# Patient Record
Sex: Female | Born: 1935
Health system: Southern US, Community
[De-identification: ages and names within clinical notes are randomized; demographics above are authoritative.]

## PROBLEM LIST (undated history)

## (undated) DIAGNOSIS — R600 Localized edema: Secondary | ICD-10-CM

## (undated) DIAGNOSIS — K219 Gastro-esophageal reflux disease without esophagitis: Secondary | ICD-10-CM

## (undated) DIAGNOSIS — M720 Palmar fascial fibromatosis [Dupuytren]: Secondary | ICD-10-CM

## (undated) DIAGNOSIS — M1990 Unspecified osteoarthritis, unspecified site: Secondary | ICD-10-CM

## (undated) DIAGNOSIS — D649 Anemia, unspecified: Secondary | ICD-10-CM

## (undated) DIAGNOSIS — I4891 Unspecified atrial fibrillation: Secondary | ICD-10-CM

## (undated) DIAGNOSIS — E039 Hypothyroidism, unspecified: Secondary | ICD-10-CM

## (undated) DIAGNOSIS — R0602 Shortness of breath: Secondary | ICD-10-CM

## (undated) DIAGNOSIS — C801 Malignant (primary) neoplasm, unspecified: Secondary | ICD-10-CM

## (undated) DIAGNOSIS — R7309 Other abnormal glucose: Secondary | ICD-10-CM

## (undated) DIAGNOSIS — I10 Essential (primary) hypertension: Secondary | ICD-10-CM

## (undated) DIAGNOSIS — F411 Generalized anxiety disorder: Secondary | ICD-10-CM

## (undated) DIAGNOSIS — E079 Disorder of thyroid, unspecified: Secondary | ICD-10-CM

## (undated) DIAGNOSIS — I441 Atrioventricular block, second degree: Secondary | ICD-10-CM

## (undated) HISTORY — DX: Disorder of thyroid, unspecified: E07.9

## (undated) HISTORY — DX: Palmar fascial fibromatosis (dupuytren): M72.0

## (undated) HISTORY — DX: Localized edema: R60.0

## (undated) HISTORY — DX: Generalized anxiety disorder: F41.1

## (undated) HISTORY — DX: Atrioventricular block, second degree: I44.1

## (undated) HISTORY — DX: Other abnormal glucose: R73.09

## (undated) HISTORY — PX: DILATION AND CURETTAGE OF UTERUS: SHX78

## (undated) HISTORY — PX: HAND SURGERY: SHX662

## (undated) HISTORY — DX: Essential (primary) hypertension: I10

## (undated) HISTORY — DX: Shortness of breath: R06.02

## (undated) HISTORY — PX: ABDOMINAL HYSTERECTOMY: SHX81

## (undated) HISTORY — PX: TONSILLECTOMY: SUR1361

## (undated) HISTORY — DX: Unspecified atrial fibrillation: I48.91

---

## 2005-05-03 ENCOUNTER — Ambulatory Visit: Payer: Self-pay | Admitting: Family Medicine

## 2005-08-25 LAB — HM SIGMOIDOSCOPY

## 2006-06-18 ENCOUNTER — Ambulatory Visit: Payer: Self-pay | Admitting: Family Medicine

## 2007-09-24 ENCOUNTER — Ambulatory Visit: Payer: Self-pay | Admitting: Family Medicine

## 2007-12-11 ENCOUNTER — Emergency Department: Payer: Self-pay | Admitting: Emergency Medicine

## 2008-12-07 ENCOUNTER — Ambulatory Visit: Payer: Self-pay | Admitting: Family Medicine

## 2010-02-23 ENCOUNTER — Ambulatory Visit: Payer: Self-pay | Admitting: Internal Medicine

## 2010-06-28 ENCOUNTER — Ambulatory Visit: Payer: Self-pay | Admitting: Oncology

## 2010-07-13 ENCOUNTER — Ambulatory Visit: Payer: Self-pay | Admitting: Oncology

## 2010-09-28 ENCOUNTER — Ambulatory Visit: Payer: Self-pay | Admitting: Oncology

## 2010-10-12 ENCOUNTER — Ambulatory Visit: Payer: Self-pay | Admitting: Oncology

## 2010-10-23 ENCOUNTER — Ambulatory Visit: Payer: Self-pay | Admitting: Internal Medicine

## 2010-12-04 ENCOUNTER — Encounter: Payer: Self-pay | Admitting: Internal Medicine

## 2010-12-12 ENCOUNTER — Encounter: Payer: Self-pay | Admitting: Internal Medicine

## 2011-02-05 ENCOUNTER — Ambulatory Visit: Payer: Self-pay | Admitting: Oncology

## 2011-02-11 ENCOUNTER — Ambulatory Visit: Payer: Self-pay | Admitting: Oncology

## 2011-02-27 ENCOUNTER — Ambulatory Visit: Payer: Self-pay | Admitting: Internal Medicine

## 2011-02-28 ENCOUNTER — Ambulatory Visit: Payer: Self-pay | Admitting: Internal Medicine

## 2011-04-30 ENCOUNTER — Other Ambulatory Visit: Payer: Self-pay | Admitting: Internal Medicine

## 2011-04-30 ENCOUNTER — Telehealth: Payer: Self-pay | Admitting: Internal Medicine

## 2011-04-30 DIAGNOSIS — R42 Dizziness and giddiness: Secondary | ICD-10-CM

## 2011-04-30 MED ORDER — MECLIZINE HCL 25 MG PO TABS: 25.0000 mg | ORAL_TABLET | Freq: Three times a day (TID) | ORAL | Status: AC | PRN

## 2011-04-30 NOTE — Telephone Encounter (Signed)
Destiny Burns at [facility] pt would like a rx of meclizine 25mg   She got  This from a walk in dr peed and would like dr walker to write a rx

## 2011-04-30 NOTE — Telephone Encounter (Signed)
I called it in 

## 2011-04-30 NOTE — Telephone Encounter (Signed)
Patient is asking for a rx for meclizine for vertigo. Uses [facility].

## 2011-05-31 ENCOUNTER — Other Ambulatory Visit: Payer: Self-pay | Admitting: *Deleted

## 2011-05-31 ENCOUNTER — Other Ambulatory Visit: Payer: Self-pay | Admitting: Internal Medicine

## 2011-05-31 MED ORDER — HYDROCHLOROTHIAZIDE 25 MG PO TABS: 25.0000 mg | ORAL_TABLET | Freq: Every day | ORAL | Status: DC

## 2011-06-01 ENCOUNTER — Encounter: Payer: Self-pay | Admitting: Internal Medicine

## 2011-06-01 ENCOUNTER — Ambulatory Visit (INDEPENDENT_AMBULATORY_CARE_PROVIDER_SITE_OTHER): Payer: Medicare Other | Admitting: Internal Medicine

## 2011-06-01 VITALS — BP 148/72 | HR 69 | Temp 97.9°F | Resp 14 | Ht 64.5 in | Wt 162.2 lb

## 2011-06-01 DIAGNOSIS — M25519 Pain in unspecified shoulder: Secondary | ICD-10-CM

## 2011-06-01 DIAGNOSIS — M25511 Pain in right shoulder: Secondary | ICD-10-CM

## 2011-06-01 DIAGNOSIS — I10 Essential (primary) hypertension: Secondary | ICD-10-CM

## 2011-06-01 DIAGNOSIS — E039 Hypothyroidism, unspecified: Secondary | ICD-10-CM

## 2011-06-01 DIAGNOSIS — E785 Hyperlipidemia, unspecified: Secondary | ICD-10-CM | POA: Insufficient documentation

## 2011-06-01 MED ORDER — HYDROCHLOROTHIAZIDE 25 MG PO TABS: 25.0000 mg | ORAL_TABLET | Freq: Every day | ORAL | Status: DC

## 2011-06-01 MED ORDER — MELOXICAM 15 MG PO TABS: 15.0000 mg | ORAL_TABLET | Freq: Every day | ORAL | Status: DC

## 2011-06-01 MED ORDER — LEVOTHYROXINE SODIUM 25 MCG PO TABS: 25.0000 ug | ORAL_TABLET | Freq: Every day | ORAL | Status: DC

## 2011-06-01 NOTE — Progress Notes (Signed)
Subjective:    Patient ID: Destiny Burns, female    DOB: 10/17/1926, 77 y.o.   MRN: [id number]  HPI Destiny Burns is an 77 year old female with a history of hypertension, hypothyroidism, and a recent history of right shoulder pain. Her right shoulder pain first began in April after a fall. She has completed physical therapy with gradual improvement in her right shoulder pain. She has refused imaging with MRI to evaluate for rotator cuff tear because she is not interested in surgical intervention. She feels that her strength continues to improve, however she still has difficulty completing some tasks such as gardening. Her work is mostly limited by pain no weakness.  She reports full compliance with her medications. She denies any other complaints today.  Outpatient Encounter Prescriptions as of 06/01/2011  Medication Sig Dispense Refill  . aspirin 81 MG tablet Take 81 mg by mouth daily.        Marland Kitchen CALCIUM PO Take 1 tablet by mouth 3 (three) times daily.        . cholecalciferol (VITAMIN D) 1000 UNITS tablet Take 1,000 Units by mouth daily.        . hydrochlorothiazide (HYDRODIURIL) 25 MG tablet Take 1 tablet (25 mg total) by mouth daily.  90 tablet  4  . levothyroxine (SYNTHROID, LEVOTHROID) 25 MCG tablet Take 1 tablet (25 mcg total) by mouth daily.  90 tablet  4  . loratadine (CLARITIN) 10 MG tablet Take 10 mg by mouth daily.        . meclizine (ANTIVERT) 25 MG tablet Take 1 tablet (25 mg total) by mouth 3 (three) times daily as needed for dizziness or nausea.  30 tablet  1    Review of Systems  Constitutional: Negative for fever, chills, appetite change, fatigue and unexpected weight change.  HENT: Negative for ear pain, congestion, sore throat, trouble swallowing, neck pain, voice change and sinus pressure.   Eyes: Negative for visual disturbance.  Respiratory: Negative for cough, shortness of breath, wheezing and stridor.   Cardiovascular: Negative for chest pain, palpitations and leg  swelling.  Gastrointestinal: Negative for nausea, vomiting, abdominal pain, diarrhea, constipation, blood in stool, abdominal distention and anal bleeding.  Genitourinary: Negative for dysuria and flank pain.  Musculoskeletal: Positive for myalgias and arthralgias. Negative for gait problem.  Skin: Negative for color change and rash.  Neurological: Negative for dizziness and headaches.  Hematological: Negative for adenopathy. Does not bruise/bleed easily.  Psychiatric/Behavioral: Negative for suicidal ideas, sleep disturbance and dysphoric mood. The patient is not nervous/anxious.    BP 148/72  Pulse 69  Temp(Src) 97.9 F (36.6 C) (Oral)  Resp 14  Ht 5' 4.5" (1.638 m)  Wt 162 lb 4 oz (73.596 kg)  BMI 27.42 kg/m2  SpO2 97%     Objective:   Physical Exam  Constitutional: She is oriented to person, place, and time. She appears well-developed and well-nourished. No distress.  HENT:  Head: Normocephalic and atraumatic.  Right Ear: External ear normal.  Left Ear: External ear normal.  Nose: Nose normal.  Mouth/Throat: Oropharynx is clear and moist. No oropharyngeal exudate.  Eyes: Conjunctivae are normal. Pupils are equal, round, and reactive to light. Right eye exhibits no discharge. Left eye exhibits no discharge. No scleral icterus.  Neck: Normal range of motion. Neck supple. No tracheal deviation present. No thyromegaly present.  Cardiovascular: Normal rate, regular rhythm, normal heart sounds and intact distal pulses.  Exam reveals no gallop and no friction rub.   No murmur heard. Pulmonary/Chest:  Effort normal and breath sounds normal. No respiratory distress. She has no wheezes. She has no rales. She exhibits no tenderness.  Musculoskeletal: She exhibits no edema and no tenderness.       Right shoulder: She exhibits decreased range of motion.  Lymphadenopathy:    She has no cervical adenopathy.  Neurological: She is alert and oriented to person, place, and time. No cranial  nerve deficit. She exhibits normal muscle tone. Coordination normal.  Skin: Skin is warm and dry. No rash noted. She is not diaphoretic. No erythema. No pallor.  Psychiatric: She has a normal mood and affect. Her behavior is normal. Judgment and thought content normal.          Assessment & Plan:  1. Hypertension - blood pressure slightly elevated today, however historically has been well controlled. Will continue hydrochlorothiazide. We'll check renal function with labs next week. She will followup in January for her annual physical.  2. Right shoulder pain -persistent for almost 7 months. Some improvement in pain and strength with physical therapy. Encouraged her to consider an MRI of her right shoulder to evaluate for rotator cuff tear. At this point she would still like to continue to monitor. She will call or return to clinic should she have worsening symptoms or decide she would like to proceed with imaging.  3. Hypothyroidism -will check TSH with fasting labs next week. She will continue Synthroid.

## 2011-06-05 ENCOUNTER — Other Ambulatory Visit: Payer: Self-pay | Admitting: *Deleted

## 2011-06-05 ENCOUNTER — Other Ambulatory Visit (INDEPENDENT_AMBULATORY_CARE_PROVIDER_SITE_OTHER): Payer: Medicare Other | Admitting: *Deleted

## 2011-06-05 DIAGNOSIS — I10 Essential (primary) hypertension: Secondary | ICD-10-CM

## 2011-06-05 DIAGNOSIS — M25511 Pain in right shoulder: Secondary | ICD-10-CM

## 2011-06-05 DIAGNOSIS — E785 Hyperlipidemia, unspecified: Secondary | ICD-10-CM

## 2011-06-05 DIAGNOSIS — E039 Hypothyroidism, unspecified: Secondary | ICD-10-CM

## 2011-06-05 LAB — COMPREHENSIVE METABOLIC PANEL
ALT: 14 U/L (ref 0–35)
AST: 29 U/L (ref 0–37)
Alkaline Phosphatase: 43 U/L (ref 39–117)
Creatinine, Ser: 0.7 mg/dL (ref 0.4–1.2)
Total Bilirubin: 0.6 mg/dL (ref 0.3–1.2)

## 2011-06-05 LAB — CBC WITH DIFFERENTIAL/PLATELET
Eosinophils Relative: 2.5 % (ref 0.0–5.0)
MCV: 90.7 fl (ref 78.0–100.0)
Monocytes Absolute: 0.2 10*3/uL (ref 0.1–1.0)
Monocytes Relative: 7.7 % (ref 3.0–12.0)
Neutrophils Relative %: 63.3 % (ref 43.0–77.0)
Platelets: 126 10*3/uL — ABNORMAL LOW (ref 150.0–400.0)
WBC: 3.2 10*3/uL — ABNORMAL LOW (ref 4.5–10.5)

## 2011-06-05 LAB — LIPID PANEL
HDL: 60.3 mg/dL (ref >39.00)
Total CHOL/HDL Ratio: 3
VLDL: 15.8 mg/dL (ref 0.0–40.0)

## 2011-06-05 LAB — TSH: TSH: 2.86 u[IU]/mL (ref 0.35–5.50)

## 2011-06-05 MED ORDER — MELOXICAM 15 MG PO TABS: 15.0000 mg | ORAL_TABLET | Freq: Every day | ORAL | Status: AC

## 2011-06-21 ENCOUNTER — Other Ambulatory Visit: Payer: Self-pay | Admitting: *Deleted

## 2011-06-21 DIAGNOSIS — I10 Essential (primary) hypertension: Secondary | ICD-10-CM

## 2011-06-21 MED ORDER — HYDROCHLOROTHIAZIDE 25 MG PO TABS: 25.0000 mg | ORAL_TABLET | Freq: Every day | ORAL | Status: DC

## 2011-07-06 ENCOUNTER — Telehealth: Payer: Self-pay | Admitting: *Deleted

## 2011-07-06 NOTE — Telephone Encounter (Signed)
Pt left VM, RE: ? About lab apt. I called pt, she wanted to clarify why she needed labs. Explained that we needed to recheck wbc & plt counts b/c they were low. Pt understood and had no further questions.

## 2011-07-09 ENCOUNTER — Other Ambulatory Visit (INDEPENDENT_AMBULATORY_CARE_PROVIDER_SITE_OTHER): Payer: Medicare Other | Admitting: *Deleted

## 2011-07-09 DIAGNOSIS — D729 Disorder of white blood cells, unspecified: Secondary | ICD-10-CM

## 2011-07-09 LAB — CBC WITH DIFFERENTIAL/PLATELET
Eosinophils Relative: 2.9 % (ref 0.0–5.0)
HCT: 35.6 % — ABNORMAL LOW (ref 36.0–46.0)
Lymphocytes Relative: 24.5 % (ref 12.0–46.0)
Lymphs Abs: 1 10*3/uL (ref 0.7–4.0)
Monocytes Relative: 5.7 % (ref 3.0–12.0)
Platelets: 117 10*3/uL — ABNORMAL LOW (ref 150.0–400.0)
WBC: 3.9 10*3/uL — ABNORMAL LOW (ref 4.5–10.5)

## 2011-07-10 ENCOUNTER — Telehealth: Payer: Self-pay | Admitting: *Deleted

## 2011-07-10 DIAGNOSIS — R7989 Other specified abnormal findings of blood chemistry: Secondary | ICD-10-CM

## 2011-07-10 NOTE — Telephone Encounter (Signed)
Referral entered  

## 2011-07-10 NOTE — Telephone Encounter (Signed)
Message copied by Vernie Murders on Tue Jul 10, 2011  9:56 AM ------      Message from: Ronna Polio A      Created: Mon Jul 09, 2011  3:51 PM       I would like to set her up with hematology as platelets and WBC persistently low. I believe that she has seen Dr. Orlie Dakin in the past, but please confirm with her.

## 2011-07-27 ENCOUNTER — Ambulatory Visit: Payer: Self-pay | Admitting: Oncology

## 2011-08-14 ENCOUNTER — Ambulatory Visit: Payer: Self-pay | Admitting: Oncology

## 2011-09-05 ENCOUNTER — Encounter: Payer: Self-pay | Admitting: Internal Medicine

## 2011-09-05 ENCOUNTER — Ambulatory Visit (INDEPENDENT_AMBULATORY_CARE_PROVIDER_SITE_OTHER): Payer: Medicare Other | Admitting: Internal Medicine

## 2011-09-05 VITALS — BP 124/70 | HR 63 | Temp 97.9°F | Ht 63.5 in | Wt 162.0 lb

## 2011-09-05 DIAGNOSIS — M25519 Pain in unspecified shoulder: Secondary | ICD-10-CM

## 2011-09-05 DIAGNOSIS — I10 Essential (primary) hypertension: Secondary | ICD-10-CM

## 2011-09-05 DIAGNOSIS — E039 Hypothyroidism, unspecified: Secondary | ICD-10-CM

## 2011-09-05 DIAGNOSIS — M25511 Pain in right shoulder: Secondary | ICD-10-CM

## 2011-09-05 DIAGNOSIS — Z0000 Encounter for general adult medical examination without abnormal findings: Secondary | ICD-10-CM

## 2011-09-05 LAB — COMPREHENSIVE METABOLIC PANEL
ALT: 15 U/L (ref 0–35)
AST: 36 U/L (ref 0–37)
Albumin: 4 g/dL (ref 3.5–5.2)
Alkaline Phosphatase: 39 U/L (ref 39–117)
Potassium: 3.9 mEq/L (ref 3.5–5.1)
Sodium: 140 mEq/L (ref 135–145)
Total Bilirubin: 0.5 mg/dL (ref 0.3–1.2)
Total Protein: 6.6 g/dL (ref 6.0–8.3)

## 2011-09-05 LAB — TSH: TSH: 1.31 u[IU]/mL (ref 0.35–5.50)

## 2011-09-05 NOTE — Progress Notes (Signed)
Subjective:    Patient ID: Destiny Burns, female    DOB: 02-03-1927, 78 y.o.   MRN: [id number]  HPI The patient is here for annual Medicare wellness examination and management of other chronic and acute problems.   The risk factors are reflected in the social history.  The roster of all physicians providing medical care to patient - is listed in the Snapshot section of the chart.  Activities of daily living:  The patient is 100% independent in all ADLs: dressing, toileting, feeding as well as independent mobility  Home safety : The patient has smoke detectors in the home. They wear seatbelts. Firearms are present in the home, kept in a safe fashion. There is no violence in the home.   There is no risks for hepatitis, STDs or HIV. There is no history of blood transfusion. They have no travel history to infectious disease endemic areas of the world.  The patient has not seen their dentist in the last six month but planning on scheduling an appointment. They have seen their eye doctor in the last year. They admit to any hearing difficulty and have not had audiologic testing in the last year.  They do not have excessive sun exposure. Discussed the need for sun protection: hats, long sleeves and use of sunscreen if there is significant sun exposure.   Diet: the importance of a healthy diet is discussed. They do have a healthy diet.  The patient has a regular exercise program: Recently participating in physical therapy and does exercises at home , twice daily 10 to 20 minutes duration, daily per week.  The benefits of regular aerobic exercise were discussed. She is looking into water aerobics, plans to start in the spring and summer.  Depression screen: there are no signs or vegative symptoms of depression- irritability, change in appetite, anhedonia, sadness/tearfullness. Finds great pleasure w/companionship of her dog.  Cognitive assessment: the patient manages all their financial and  personal affairs and is actively engaged. They could relate day,date,year and events; recalled 3/3 objects at 3 minutes; performed clock-face test normally.  The following portions of the patient's history were reviewed and updated as appropriate: allergies, current medications, past family history, past medical history,  past surgical history, past social history  and problem list.  Vision, hearing, body mass index were assessed and reviewed.   During the course of the visit the patient was educated and counseled about appropriate screening and preventive services including : fall prevention , diabetes screening, nutrition counseling, colorectal cancer screening, and recommended immunizations.   In regards to hypertension, patient reports full compliance with her medication. She denies any chest pain, palpitations, or headache.  In regards to hypothyroidism, patient reports full compliance with her medication. She denies any fatigue, constipation, or temperature intolerance.  In regards to right shoulder pain, patient notes improvement with use of physical therapy. She continues to do exercises at home on a regular basis. She also uses meloxicam on occasion for pain. She reports improvement with this medication.  Outpatient Encounter Prescriptions as of 09/05/2011  Medication Sig Dispense Refill  . aspirin 81 MG tablet Take 81 mg by mouth daily.        Marland Kitchen CALCIUM PO Take 1 tablet by mouth 3 (three) times daily.        . cholecalciferol (VITAMIN D) 1000 UNITS tablet Take 1,000 Units by mouth daily.        . hydrochlorothiazide (HYDRODIURIL) 25 MG tablet Take 1 tablet (25 mg total) by mouth  daily.  90 tablet  4  . levothyroxine (SYNTHROID, LEVOTHROID) 25 MCG tablet Take 1 tablet (25 mcg total) by mouth daily.  90 tablet  4  . loratadine (CLARITIN) 10 MG tablet Take 10 mg by mouth daily.        . meclizine (ANTIVERT) 25 MG tablet Take 1 tablet (25 mg total) by mouth 3 (three) times daily as needed  for dizziness or nausea.  30 tablet  1  . meloxicam (MOBIC) 15 MG tablet Take 1 tablet (15 mg total) by mouth daily. As needed for right shoulder pain.  30 tablet  2    Review of Systems  Constitutional: Negative for fever, chills, appetite change, fatigue and unexpected weight change.  HENT: Negative for ear pain, congestion, sore throat, trouble swallowing, neck pain, voice change and sinus pressure.   Eyes: Negative for visual disturbance.  Respiratory: Negative for cough, shortness of breath, wheezing and stridor.   Cardiovascular: Negative for chest pain, palpitations and leg swelling.  Gastrointestinal: Negative for nausea, vomiting, abdominal pain, diarrhea, constipation, blood in stool, abdominal distention and anal bleeding.  Genitourinary: Negative for dysuria and flank pain.  Musculoskeletal: Negative for myalgias, arthralgias and gait problem.  Skin: Negative for color change and rash.  Neurological: Negative for dizziness and headaches.  Hematological: Negative for adenopathy. Does not bruise/bleed easily.  Psychiatric/Behavioral: Negative for suicidal ideas, sleep disturbance and dysphoric mood. The patient is not nervous/anxious.    BP 124/70  Pulse 63  Temp(Src) 97.9 F (36.6 C) (Oral)  Ht 5' 3.5" (1.613 m)  Wt 162 lb (73.483 kg)  BMI 28.25 kg/m2  SpO2 97%     Objective:   Physical Exam  Constitutional: She is oriented to person, place, and time. She appears well-developed and well-nourished. No distress.  HENT:  Head: Normocephalic and atraumatic.  Right Ear: External ear normal.  Left Ear: External ear normal.  Nose: Nose normal.  Mouth/Throat: Oropharynx is clear and moist. No oropharyngeal exudate.  Eyes: Conjunctivae are normal. Pupils are equal, round, and reactive to light. Right eye exhibits no discharge. Left eye exhibits no discharge. No scleral icterus.  Neck: Normal range of motion. Neck supple. No tracheal deviation present. No thyromegaly present.    Cardiovascular: Normal rate, regular rhythm, normal heart sounds and intact distal pulses.  Exam reveals no gallop and no friction rub.   No murmur heard. Pulmonary/Chest: Effort normal and breath sounds normal. No respiratory distress. She has no wheezes. She has no rales. She exhibits no tenderness.  Abdominal: Soft. Bowel sounds are normal. She exhibits no distension and no mass. There is no tenderness. There is no rebound and no guarding.  Musculoskeletal: Normal range of motion. She exhibits no edema and no tenderness.  Lymphadenopathy:    She has no cervical adenopathy.  Neurological: She is alert and oriented to person, place, and time. No cranial nerve deficit. She exhibits normal muscle tone. Coordination normal.  Skin: Skin is warm and dry. No rash noted. She is not diaphoretic. No erythema. No pallor.  Psychiatric: She has a normal mood and affect. Her behavior is normal. Judgment and thought content normal.          Assessment & Plan:

## 2011-09-05 NOTE — Assessment & Plan Note (Signed)
Symptoms gradually improving with physical therapy. Will continue to monitor.

## 2011-09-05 NOTE — Assessment & Plan Note (Signed)
Will check TSH with labs today. Continue Synthroid. 

## 2011-09-05 NOTE — Assessment & Plan Note (Signed)
BP well controlled on current meds. Will check renal function with labs today.

## 2011-09-19 ENCOUNTER — Encounter: Payer: Self-pay | Admitting: Internal Medicine

## 2011-10-04 ENCOUNTER — Ambulatory Visit (INDEPENDENT_AMBULATORY_CARE_PROVIDER_SITE_OTHER): Payer: Medicare Other | Admitting: Internal Medicine

## 2011-10-04 ENCOUNTER — Telehealth: Payer: Self-pay | Admitting: Internal Medicine

## 2011-10-04 ENCOUNTER — Encounter: Payer: Self-pay | Admitting: Internal Medicine

## 2011-10-04 VITALS — BP 140/68 | HR 81 | Temp 98.2°F | Ht 63.5 in | Wt 164.0 lb

## 2011-10-04 DIAGNOSIS — N390 Urinary tract infection, site not specified: Secondary | ICD-10-CM | POA: Insufficient documentation

## 2011-10-04 DIAGNOSIS — R350 Frequency of micturition: Secondary | ICD-10-CM

## 2011-10-04 LAB — POCT URINALYSIS DIPSTICK
Bilirubin, UA: NEGATIVE
Glucose, UA: NEGATIVE
Ketones, UA: NEGATIVE
Nitrite, UA: NEGATIVE
pH, UA: 7.5

## 2011-10-04 MED ORDER — CIPROFLOXACIN HCL 250 MG PO TABS: 250.0000 mg | ORAL_TABLET | Freq: Two times a day (BID) | ORAL | Status: AC

## 2011-10-04 NOTE — Telephone Encounter (Signed)
[facility] Triage Call Report Triage Record Num: [id number] Operator: Baldomero Lamy Patient Name: Destiny Burns Call Date & Time: 10/04/2011 12:47:29PM Patient Phone: [phone] PCP: Ronna Polio Patient Gender: Female PCP Fax : [phone] Patient DOB: [birth year] Practice Name: [facility] Reason for Call: Caller: Aija/Patient; PCP: Ronna Polio; CB#: [phone]; ; ; Call regarding Urinary Pain/Bleeding; Pt calling regarding UTI sxs onset-2/19. Afebrile. Pt wishes an antibiotic be called in. Explained that pt should be seen for U/A first. No appt avail today, 2/21. Pt does not wish to come on 2/22 b/c of snow forecast. Pt wishes to ask MD if antibiotic can be called in. Pt call back # above. Note sent to office. Protocol(s) Used: Urinary Symptoms - Female Recommended Outcome per Protocol: See Provider within 4 hours Reason for Outcome: Urinary tract symptoms AND any flank or low back pain Care Advice: ~ Another adult should drive. ~ Tell provider medical history of renal disease; especially if have only one kidney. ~ Call provider if symptoms worsen, such as increasing pain in low back, pelvis, or side(s); blood in urine; or fever. Increase intake of fluids. Try to drink 8 oz. (.2 liter) every hour when awake, including unsweetened cranberry juice, unless on restricted fluids for other medical reasons. Take sips of fluid or eat ice chips if nauseated or vomiting. ~ ~ Tell your provider if you are taking Warfarin (Coumadin) and drinking cranberry juice or taking cranberry capsules. ~ SYMPTOM / CONDITION MANAGEMENT ~ CAUTIONS ~ List, or take, all current prescription(s), nonprescription or alternative medication(s) to provider for evaluation. Limit carbonated, alcoholic, and caffeinated beverages such as coffee, tea and soda. Avoid nonprescription cold and allergy medications that contain caffeine. Limit intake of tomatoes, fruit juices  (except for unsweetened cranberry juice), dairy products, spicy foods, sugar, and artificial sweeteners (aspartame or saccharine). Stop or decrease smoking. Reducing exposure to bladder irritants may help lessen urgency. ~ Analgesic/Antipyretic Advice - Acetaminophen: Consider acetaminophen as directed on label or by pharmacist/provider for pain or fever PRECAUTIONS: - Use if there is no history of liver disease, alcoholism, or intake of three or more alcohol drinks per day - Only if approved by provider during pregnancy or when breastfeeding - During pregnancy, acetaminophen should not be taken more than 3 consecutive days without telling provider - Do not exceed recommended dose or frequency ~ Systemic Inflammatory Response Syndrome (SIRS): Watch for signs of a generalized, whole body infection. Occurs within days of a localized infection, especially of the urinary, GI, respiratory or nervous systems; or after a traumatic injury or invasive procedure. - Call EMS 911 if symptoms have worsened, such as increasing confusion or unusual drowsiness; cold and clammy skin; no urine output; rapid respiration (>30/min.) or slow respiration (<10/min.); struggling to breathe. - Go to the ED immediately for early symptoms of rapid pulse >90/min. or rapid breathing >20/min. at rest; chills; oral temperature >100.4 F (38 C) or <96.8 F (36 C) when associated with conditions noted. ~ Analgesic/Antipyretic Advice - NSAIDs: Consider aspirin, ibuprofen, naproxen or ketoprofen for pain or fever as directed on label or by pharmacist/provider. PRECAUTIONS: ~ 10/04/2011 1:03:01PM Page 1 of 2 [email or web address] [facility] Triage Call Report Patient Name: Destiny Burns continuation page/s - If over 84 years of age, should not take longer than 1 week without consulting provider. EXCEPTIONS: - Should not be used if taking blood thinners or have bleeding problems. - Do not use if have history of  sensitivity/allergy to any of these medications; or history of  cardiovascular, ulcer, kidney, liver disease or diabetes unless approved by provider. - Do not exceed recommended dose or frequency. 10/04/2011 1:03:01PM Page 2 of 2 [email or web address]

## 2011-10-04 NOTE — Telephone Encounter (Signed)
[facility] Triage Call Report Triage Record Num: [id number] Operator: Revonda Humphrey Patient Name: Destiny Burns Call Date & Time: 10/04/2011 3:45:14PM Patient Phone: [phone] PCP: Ronna Polio Patient Gender: Female PCP Fax : [phone] Patient DOB: 01/18/1927 Practice Name: [facility] Reason for Call: Caller: Taira/Patient; PCP: Ronna Polio; CB#: [phone]; ; ; Call regarding Urinary Pain/Bleeding; Stomach did not feel right and side pain 2 days ago 10/02/2011. Urinating very frequently. Side pain has gone away but still frequency. Legs feeling weak, temp 98.6. Just wanting Rx called in. Will come to office now for urinalysis, appt Dr Dan Humphreys 4:45 pm. Protocol(s) Used: Urinary Symptoms - Female Recommended Outcome per Protocol: See Provider within 24 hours Reason for Outcome: Has one or more urinary tract symptoms AND has not been previously evaluated Care Advice: ~ Call provider if you develop flank or low back pain, fever, generally feel sick. Increase intake of fluids. Try to drink 8 oz. (.2 liter) every hour when awake, including unsweetened cranberry juice, unless on restricted fluids for other medical reasons. Take sips of fluid or eat ice chips if nauseated or vomiting. ~ Limit carbonated, alcoholic, and caffeinated beverages such as coffee, tea and soda. Avoid nonprescription cold and allergy medications that contain caffeine. Limit intake of tomatoes, fruit juices (except for unsweetened cranberry juice), dairy products, spicy foods, sugar, and artificial sweeteners (aspartame or saccharine). Stop or decrease smoking. Reducing exposure to bladder irritants may help lessen urgency. ~ ~ Call provider if urine is pink, red, smoky or cola colored. 10/04/2011 4:07:53PM Page 1 of 1 [email or web address]

## 2011-10-04 NOTE — Assessment & Plan Note (Signed)
Symptoms and urinalysis consistent with urinary tract infection. Will treat with Cipro. Will send urine for culture. Patient will call if symptoms are not improving over the next 24 hours.

## 2011-10-04 NOTE — Telephone Encounter (Signed)
Office Message [address] [place], [place] [address] p. [phone] f. [phone] To: [facility] (Daytime Triage) Fax: [phone] From: [facility] Date/ Time: 10/04/2011 1:01 PM Taken By: Baldomero Lamy, RN Caller: Emmani Facility: not collected Patient: Destiny Burns, Giampietro DOB: 1927/03/20 Phone: [phone] Reason for Call: Pt has UTI sxs. Emergent sxs r/o. 4 hr disposition for UTI sxs with flank pain. Afebrile. NO appt avail today per EPIC. PT REFUSED APT FOR 2/22 B/C OF FORECAST OF BAD WEATHER. PT WISHES AN ANTIBX BE CALLED IN. EXPLAINED THAT PROTOCOL IS TO HAVE U/A DONE 1ST. PT ASKED THAT MD BE CONSULTED 1ST. Regarding Appointment: Appt Date: Appt Time: Unknown Provider: Reason: Details: Outcome:

## 2011-10-04 NOTE — Progress Notes (Signed)
Subjective:    Patient ID: Destiny Burns, female    DOB: 1927/07/25, 78 y.o.   MRN: [id number]  HPI  78 year old female presents for acute visit complaining of two-day history of subjective fever, chills, urinary frequency, urgency , and suprapubic pain. She also notes some burning with urination. She denies any flank pain. She has not taken any medication for this. She has tried increasing her fluid intake. She reports a history of chronic urinary tract infections in the past.  Outpatient Encounter Prescriptions as of 10/04/2011  Medication Sig Dispense Refill  . aspirin 81 MG tablet Take 81 mg by mouth daily.        Marland Kitchen CALCIUM PO Take 1 tablet by mouth 3 (three) times daily.        . cholecalciferol (VITAMIN D) 1000 UNITS tablet Take 1,000 Units by mouth daily.        . hydrochlorothiazide (HYDRODIURIL) 25 MG tablet Take 1 tablet (25 mg total) by mouth daily.  90 tablet  4  . levothyroxine (SYNTHROID, LEVOTHROID) 25 MCG tablet Take 1 tablet (25 mcg total) by mouth daily.  90 tablet  4  . loratadine (CLARITIN) 10 MG tablet Take 10 mg by mouth daily.        . meclizine (ANTIVERT) 25 MG tablet Take 1 tablet (25 mg total) by mouth 3 (three) times daily as needed for dizziness or nausea.  30 tablet  1  . meloxicam (MOBIC) 15 MG tablet Take 1 tablet (15 mg total) by mouth daily. As needed for right shoulder pain.  30 tablet  2  . ciprofloxacin (CIPRO) 250 MG tablet Take 1 tablet (250 mg total) by mouth 2 (two) times daily.  14 tablet  0    Review of Systems  Constitutional: Positive for fever and chills. Negative for appetite change, fatigue and unexpected weight change.  Eyes: Negative for visual disturbance.  Respiratory: Negative for shortness of breath.   Cardiovascular: Negative for chest pain and leg swelling.  Gastrointestinal: Positive for abdominal pain. Negative for nausea, vomiting, diarrhea, constipation, blood in stool and anal bleeding.  Genitourinary: Positive for dysuria,  urgency, frequency and pelvic pain. Negative for hematuria, flank pain, decreased urine volume and difficulty urinating.  Musculoskeletal: Negative for myalgias, arthralgias and gait problem.  Skin: Negative for color change and rash.  Neurological: Negative for dizziness and headaches.  Hematological: Negative for adenopathy. Does not bruise/bleed easily.  Psychiatric/Behavioral: Negative for suicidal ideas, sleep disturbance and dysphoric mood. The patient is not nervous/anxious.    BP 140/68  Pulse 81  Temp(Src) 98.2 F (36.8 C) (Oral)  Ht 5' 3.5" (1.613 m)  Wt 164 lb (74.39 kg)  BMI 28.60 kg/m2  SpO2 94%     Objective:   Physical Exam  Constitutional: She is oriented to person, place, and time. She appears well-developed and well-nourished. No distress.  HENT:  Head: Normocephalic and atraumatic.  Right Ear: External ear normal.  Left Ear: External ear normal.  Nose: Nose normal.  Mouth/Throat: Oropharynx is clear and moist. No oropharyngeal exudate.  Eyes: Conjunctivae are normal. Pupils are equal, round, and reactive to light. Right eye exhibits no discharge. Left eye exhibits no discharge. No scleral icterus.  Neck: Normal range of motion. Neck supple. No tracheal deviation present. No thyromegaly present.  Cardiovascular: Normal rate, regular rhythm, normal heart sounds and intact distal pulses.  Exam reveals no gallop and no friction rub.   No murmur heard. Pulmonary/Chest: Effort normal and breath sounds normal. No respiratory distress.  She has no wheezes. She has no rales. She exhibits no tenderness.  Abdominal: There is no CVA tenderness.  Musculoskeletal: Normal range of motion. She exhibits no edema and no tenderness.  Lymphadenopathy:    She has no cervical adenopathy.  Neurological: She is alert and oriented to person, place, and time. No cranial nerve deficit. She exhibits normal muscle tone. Coordination normal.  Skin: Skin is warm and dry. No rash noted. She is  not diaphoretic. No erythema. No pallor.  Psychiatric: She has a normal mood and affect. Her behavior is normal. Judgment and thought content normal.          Assessment & Plan:

## 2011-10-08 ENCOUNTER — Ambulatory Visit: Payer: Medicare Other | Admitting: Internal Medicine

## 2011-10-11 LAB — URINE CULTURE: Colony Count: >=100000

## 2011-11-02 ENCOUNTER — Telehealth: Payer: Self-pay | Admitting: *Deleted

## 2011-11-02 MED ORDER — LORAZEPAM 0.5 MG PO TABS: 0.5000 mg | ORAL_TABLET | Freq: Three times a day (TID) | ORAL | Status: DC | PRN

## 2011-11-02 NOTE — Telephone Encounter (Signed)
Ok RF per MD, called in

## 2012-03-05 ENCOUNTER — Ambulatory Visit: Payer: Medicare Other | Admitting: Internal Medicine

## 2012-03-12 ENCOUNTER — Encounter: Payer: Self-pay | Admitting: Internal Medicine

## 2012-03-12 ENCOUNTER — Ambulatory Visit (INDEPENDENT_AMBULATORY_CARE_PROVIDER_SITE_OTHER): Payer: Medicare Other | Admitting: Internal Medicine

## 2012-03-12 VITALS — BP 130/70 | HR 74 | Temp 98.4°F | Ht 63.5 in | Wt 158.0 lb

## 2012-03-12 DIAGNOSIS — K5792 Diverticulitis of intestine, part unspecified, without perforation or abscess without bleeding: Secondary | ICD-10-CM | POA: Insufficient documentation

## 2012-03-12 DIAGNOSIS — K5732 Diverticulitis of large intestine without perforation or abscess without bleeding: Secondary | ICD-10-CM

## 2012-03-12 DIAGNOSIS — N390 Urinary tract infection, site not specified: Secondary | ICD-10-CM | POA: Insufficient documentation

## 2012-03-12 LAB — POCT URINALYSIS DIPSTICK
Glucose, UA: NEGATIVE
Ketones, UA: NEGATIVE

## 2012-03-12 MED ORDER — METRONIDAZOLE 500 MG PO TABS: 500.0000 mg | ORAL_TABLET | Freq: Three times a day (TID) | ORAL | Status: AC

## 2012-03-12 MED ORDER — CIPROFLOXACIN HCL 500 MG PO TABS: 500.0000 mg | ORAL_TABLET | Freq: Two times a day (BID) | ORAL | Status: AC

## 2012-03-12 NOTE — Assessment & Plan Note (Signed)
Symptoms consistent with urinary tract infection. Will send urine for culture.

## 2012-03-12 NOTE — Progress Notes (Signed)
Subjective:    Patient ID: Destiny Burns, female    DOB: September 12, 1926, 78 y.o.   MRN: [id number]  HPI 78 year old female with history of diverticulitis presents for acute visit complaining of approximately one-week history of left lower cautery and abdominal pain which radiates to her back. She also had frequent episodes of watery, nonbloody diarrhea, however these have now resolved. After developing these symptoms, she also developed some dysuria and increased urinary frequency. She reports some possible subjective fever. She denies any chills. She has not taken any medication for her symptoms.  Outpatient Encounter Prescriptions as of 03/12/2012  Medication Sig Dispense Refill  . aspirin 81 MG tablet Take 81 mg by mouth daily.        Marland Kitchen CALCIUM PO Take 1 tablet by mouth 3 (three) times daily.        . cholecalciferol (VITAMIN D) 1000 UNITS tablet Take 1,000 Units by mouth daily.        . hydrochlorothiazide (HYDRODIURIL) 25 MG tablet Take 1 tablet (25 mg total) by mouth daily.  90 tablet  4  . levothyroxine (SYNTHROID, LEVOTHROID) 25 MCG tablet Take 1 tablet (25 mcg total) by mouth daily.  90 tablet  4  . loratadine (CLARITIN) 10 MG tablet Take 10 mg by mouth daily.        . meclizine (ANTIVERT) 25 MG tablet Take 1 tablet (25 mg total) by mouth 3 (three) times daily as needed for dizziness or nausea.  30 tablet  1  . meloxicam (MOBIC) 15 MG tablet Take 1 tablet (15 mg total) by mouth daily. As needed for right shoulder pain.  30 tablet  2  . ciprofloxacin (CIPRO) 500 MG tablet Take 1 tablet (500 mg total) by mouth 2 (two) times daily.  14 tablet  0  . metroNIDAZOLE (FLAGYL) 500 MG tablet Take 1 tablet (500 mg total) by mouth 3 (three) times daily.  21 tablet  0    Review of Systems  Constitutional: Negative for fever, chills, appetite change, fatigue and unexpected weight change.  HENT: Negative for ear pain, congestion, sore throat, trouble swallowing, neck pain, voice change and sinus  pressure.   Eyes: Negative for visual disturbance.  Respiratory: Negative for cough, shortness of breath, wheezing and stridor.   Cardiovascular: Negative for chest pain, palpitations and leg swelling.  Gastrointestinal: Negative for nausea, vomiting, abdominal pain, diarrhea, constipation, blood in stool, abdominal distention and anal bleeding.  Genitourinary: Negative for dysuria and flank pain.  Musculoskeletal: Negative for myalgias, arthralgias and gait problem.  Skin: Negative for color change and rash.  Neurological: Negative for dizziness and headaches.  Hematological: Negative for adenopathy. Does not bruise/bleed easily.  Psychiatric/Behavioral: Negative for suicidal ideas, disturbed wake/sleep cycle and dysphoric mood. The patient is not nervous/anxious.    BP 130/70  Pulse 74  Temp 98.4 F (36.9 C) (Oral)  Ht 5' 3.5" (1.613 m)  Wt 158 lb (71.668 kg)  BMI 27.55 kg/m2  SpO2 95%     Objective:   Physical Exam  Constitutional: She is oriented to person, place, and time. She appears well-developed and well-nourished. No distress.  HENT:  Head: Normocephalic and atraumatic.  Right Ear: External ear normal.  Left Ear: External ear normal.  Nose: Nose normal.  Mouth/Throat: Oropharynx is clear and moist. No oropharyngeal exudate.  Eyes: Conjunctivae are normal. Pupils are equal, round, and reactive to light. Right eye exhibits no discharge. Left eye exhibits no discharge. No scleral icterus.  Neck: Normal range of motion. Neck  supple. No tracheal deviation present. No thyromegaly present.  Cardiovascular: Normal rate, regular rhythm, normal heart sounds and intact distal pulses.  Exam reveals no gallop and no friction rub.   No murmur heard. Pulmonary/Chest: Effort normal and breath sounds normal. No respiratory distress. She has no wheezes. She has no rales. She exhibits no tenderness.  Abdominal: Soft. Bowel sounds are normal. She exhibits no distension and no mass. There is  tenderness (left lower quadrant). There is no guarding.  Musculoskeletal: Normal range of motion. She exhibits no edema and no tenderness.  Lymphadenopathy:    She has no cervical adenopathy.  Neurological: She is alert and oriented to person, place, and time. No cranial nerve deficit. She exhibits normal muscle tone. Coordination normal.  Skin: Skin is warm and dry. No rash noted. She is not diaphoretic. No erythema. No pallor.  Psychiatric: She has a normal mood and affect. Her behavior is normal. Judgment and thought content normal.          Assessment & Plan:

## 2012-03-12 NOTE — Assessment & Plan Note (Signed)
Symptoms and exam are consistent with diverticulitis. Will treat with Cipro and Flagyl. Pt will follow up in 1 week or sooner if symptoms not continuing to improve.

## 2012-03-15 LAB — URINE CULTURE

## 2012-03-17 ENCOUNTER — Other Ambulatory Visit: Payer: Self-pay | Admitting: *Deleted

## 2012-03-17 MED ORDER — CEPHALEXIN 500 MG PO CAPS: 500.0000 mg | ORAL_CAPSULE | Freq: Three times a day (TID) | ORAL | Status: DC

## 2012-03-19 ENCOUNTER — Ambulatory Visit: Payer: Medicare Other | Admitting: Internal Medicine

## 2012-03-21 ENCOUNTER — Ambulatory Visit (INDEPENDENT_AMBULATORY_CARE_PROVIDER_SITE_OTHER): Payer: Medicare Other | Admitting: Internal Medicine

## 2012-03-21 ENCOUNTER — Encounter: Payer: Self-pay | Admitting: Internal Medicine

## 2012-03-21 VITALS — BP 130/70 | HR 64 | Temp 98.2°F | Ht 63.5 in | Wt 160.2 lb

## 2012-03-21 DIAGNOSIS — N399 Disorder of urinary system, unspecified: Secondary | ICD-10-CM

## 2012-03-21 DIAGNOSIS — E039 Hypothyroidism, unspecified: Secondary | ICD-10-CM

## 2012-03-21 DIAGNOSIS — B370 Candidal stomatitis: Secondary | ICD-10-CM

## 2012-03-21 DIAGNOSIS — K5732 Diverticulitis of large intestine without perforation or abscess without bleeding: Secondary | ICD-10-CM

## 2012-03-21 DIAGNOSIS — K5792 Diverticulitis of intestine, part unspecified, without perforation or abscess without bleeding: Secondary | ICD-10-CM

## 2012-03-21 DIAGNOSIS — R3989 Other symptoms and signs involving the genitourinary system: Secondary | ICD-10-CM

## 2012-03-21 LAB — POCT URINALYSIS DIPSTICK
Ketones, UA: NEGATIVE
Leukocytes, UA: NEGATIVE
Protein, UA: NEGATIVE
Spec Grav, UA: 1.025
pH, UA: 6

## 2012-03-21 LAB — COMPREHENSIVE METABOLIC PANEL
CO2: 27 mEq/L (ref 19–32)
Creatinine, Ser: 0.7 mg/dL (ref 0.4–1.2)
GFR: 86.25 mL/min (ref >60.00)
Glucose, Bld: 106 mg/dL — ABNORMAL HIGH (ref 70–99)
Total Bilirubin: 0.3 mg/dL (ref 0.3–1.2)

## 2012-03-21 MED ORDER — FLUCONAZOLE 150 MG PO TABS: 150.0000 mg | ORAL_TABLET | Freq: Every day | ORAL | Status: AC

## 2012-03-21 MED ORDER — LEVOTHYROXINE SODIUM 25 MCG PO TABS: 25.0000 ug | ORAL_TABLET | Freq: Every day | ORAL | Status: DC

## 2012-03-21 NOTE — Progress Notes (Signed)
Subjective:    Patient ID: Destiny Burns, female    DOB: 1926/10/19, 78 y.o.   MRN: [id number]  HPI 78 year old female with history of diverticulitis presents for followup. She reports that her symptoms of abdominal pain and diarrhea have improved with use of antibiotics. She continues to have some mild tenderness in her left lower abdomen but reports this is markedly improved compared to previous. She denies any fever or chills. She denies any ongoing diarrhea. She reports that she has advanced her diet some without any noticeable problems.  She is concerned today about whitish covering over her tongue. She first noticed this after being on antibiotics. She has tried scraping it off with her toothbrush with no improvement.  She also noted recently that her urine appears to be tea colored. She denies any pain when she urinates, urinary frequency, flank pain, fever, or chills.  Outpatient Encounter Prescriptions as of 03/21/2012  Medication Sig Dispense Refill  . aspirin 81 MG tablet Take 81 mg by mouth daily.        Marland Kitchen CALCIUM PO Take 1 tablet by mouth 3 (three) times daily.        . cephALEXin (KEFLEX) 500 MG capsule Take 1 capsule (500 mg total) by mouth 3 (three) times daily.  21 capsule  0  . cholecalciferol (VITAMIN D) 1000 UNITS tablet Take 1,000 Units by mouth daily.        . ciprofloxacin (CIPRO) 500 MG tablet Take 1 tablet (500 mg total) by mouth 2 (two) times daily.  14 tablet  0  . hydrochlorothiazide (HYDRODIURIL) 25 MG tablet Take 1 tablet (25 mg total) by mouth daily.  90 tablet  4  . levothyroxine (SYNTHROID, LEVOTHROID) 25 MCG tablet Take 1 tablet (25 mcg total) by mouth daily.  90 tablet  4  . loratadine (CLARITIN) 10 MG tablet Take 10 mg by mouth daily.        . meclizine (ANTIVERT) 25 MG tablet Take 1 tablet (25 mg total) by mouth 3 (three) times daily as needed for dizziness or nausea.  30 tablet  1  . meloxicam (MOBIC) 15 MG tablet Take 1 tablet (15 mg total) by mouth  daily. As needed for right shoulder pain.  30 tablet  2  . metroNIDAZOLE (FLAGYL) 500 MG tablet Take 1 tablet (500 mg total) by mouth 3 (three) times daily.  21 tablet  0  . DISCONTD: levothyroxine (SYNTHROID, LEVOTHROID) 25 MCG tablet Take 1 tablet (25 mcg total) by mouth daily.  90 tablet  4  . fluconazole (DIFLUCAN) 150 MG tablet Take 1 tablet (150 mg total) by mouth daily.  3 tablet  0   BP 130/70  Pulse 64  Temp 98.2 F (36.8 C) (Oral)  Ht 5' 3.5" (1.613 m)  Wt 160 lb 4 oz (72.689 kg)  BMI 27.94 kg/m2  SpO2 98%  Review of Systems  Constitutional: Negative for fever, chills, appetite change, fatigue and unexpected weight change.  HENT: Negative for ear pain, congestion, sore throat, trouble swallowing, neck pain, voice change and sinus pressure.   Eyes: Negative for visual disturbance.  Respiratory: Negative for cough, shortness of breath, wheezing and stridor.   Cardiovascular: Negative for chest pain, palpitations and leg swelling.  Gastrointestinal: Positive for abdominal pain. Negative for nausea, vomiting, diarrhea, constipation, blood in stool, abdominal distention and anal bleeding.  Genitourinary: Negative for dysuria and flank pain.  Musculoskeletal: Negative for myalgias, arthralgias and gait problem.  Skin: Negative for color change and rash.  Neurological: Negative for dizziness and headaches.  Hematological: Negative for adenopathy. Does not bruise/bleed easily.  Psychiatric/Behavioral: Negative for suicidal ideas, disturbed wake/sleep cycle and dysphoric mood. The patient is not nervous/anxious.        Objective:   Physical Exam  Constitutional: She is oriented to person, place, and time. She appears well-developed and well-nourished. No distress.  HENT:  Head: Normocephalic and atraumatic.  Right Ear: External ear normal.  Left Ear: External ear normal.  Nose: Nose normal.  Mouth/Throat: Oropharyngeal exudate present.  Eyes: Conjunctivae are normal. Pupils  are equal, round, and reactive to light. Right eye exhibits no discharge. Left eye exhibits no discharge. No scleral icterus.  Neck: Normal range of motion. Neck supple. No tracheal deviation present. No thyromegaly present.  Cardiovascular: Normal rate, regular rhythm, normal heart sounds and intact distal pulses.  Exam reveals no gallop and no friction rub.   No murmur heard. Pulmonary/Chest: Effort normal and breath sounds normal. No respiratory distress. She has no wheezes. She has no rales. She exhibits no tenderness.  Abdominal: Soft. Normal appearance and bowel sounds are normal. There is tenderness (mild) in the left upper quadrant.  Musculoskeletal: Normal range of motion. She exhibits no edema and no tenderness.  Lymphadenopathy:    She has no cervical adenopathy.  Neurological: She is alert and oriented to person, place, and time. No cranial nerve deficit. She exhibits normal muscle tone. Coordination normal.  Skin: Skin is warm and dry. No rash noted. She is not diaphoretic. No erythema. No pallor.  Psychiatric: She has a normal mood and affect. Her behavior is normal. Judgment and thought content normal.          Assessment & Plan:

## 2012-03-21 NOTE — Assessment & Plan Note (Signed)
Symptoms markedly improved with antibiotics. Tolerating diet well. Will continue to monitor.

## 2012-03-21 NOTE — Assessment & Plan Note (Signed)
Pt reports tea colored urine. Will send urinalysis and check CMP today.

## 2012-03-21 NOTE — Assessment & Plan Note (Signed)
Exam consistent with thrush. Will treat with Diflucan x 3 days.

## 2012-04-01 ENCOUNTER — Telehealth: Payer: Self-pay | Admitting: *Deleted

## 2012-04-01 ENCOUNTER — Ambulatory Visit (INDEPENDENT_AMBULATORY_CARE_PROVIDER_SITE_OTHER): Payer: Medicare Other | Admitting: *Deleted

## 2012-04-01 DIAGNOSIS — N390 Urinary tract infection, site not specified: Secondary | ICD-10-CM

## 2012-04-01 LAB — POCT URINALYSIS DIPSTICK
Glucose, UA: NEGATIVE
Nitrite, UA: POSITIVE
Urobilinogen, UA: 0.2

## 2012-04-01 MED ORDER — CEPHALEXIN 500 MG PO CAPS: 500.0000 mg | ORAL_CAPSULE | Freq: Three times a day (TID) | ORAL | Status: DC

## 2012-04-01 NOTE — Addendum Note (Signed)
Addended by: Gilmer Mor on: 04/01/2012 02:47 PM   Modules accepted: Level of Service

## 2012-04-01 NOTE — Telephone Encounter (Signed)
We can refill Keflex, but she should drop off urine sample to send for UA and culture.

## 2012-04-01 NOTE — Telephone Encounter (Signed)
Patient advised as instructed via telephone, Rx for Keflex sent to [facility] pharmacy and patient will come by to drop off urine for ua and culture.

## 2012-04-01 NOTE — Telephone Encounter (Signed)
Patient called stating that she thinks her UTI has come back.  She is wanting to know if she can get a refill on Keflex?  Please advise.

## 2012-04-05 LAB — URINE CULTURE: Colony Count: >=100000

## 2012-04-25 ENCOUNTER — Other Ambulatory Visit: Payer: Self-pay | Admitting: *Deleted

## 2012-04-25 MED ORDER — FLUCONAZOLE 150 MG PO TABS: 150.0000 mg | ORAL_TABLET | Freq: Every day | ORAL | Status: DC

## 2012-04-25 NOTE — Telephone Encounter (Signed)
Patient called stating that she has a white coating on her tongue like before, she wants to know if she can get Rx for Diflucan refilled?  Please advise.

## 2012-04-25 NOTE — Telephone Encounter (Signed)
Rx sent to pharmacy, patient advised via message left on machine at home. 

## 2012-05-02 ENCOUNTER — Ambulatory Visit: Payer: Self-pay | Admitting: Internal Medicine

## 2012-05-22 ENCOUNTER — Encounter: Payer: Self-pay | Admitting: Internal Medicine

## 2012-07-07 ENCOUNTER — Other Ambulatory Visit: Payer: Self-pay | Admitting: General Practice

## 2012-07-07 DIAGNOSIS — I10 Essential (primary) hypertension: Secondary | ICD-10-CM

## 2012-07-07 MED ORDER — LORAZEPAM 0.5 MG PO TABS: 0.5000 mg | ORAL_TABLET | Freq: Three times a day (TID) | ORAL | Status: DC | PRN

## 2012-07-07 MED ORDER — HYDROCHLOROTHIAZIDE 25 MG PO TABS: 25.0000 mg | ORAL_TABLET | Freq: Every day | ORAL | Status: DC

## 2012-07-24 ENCOUNTER — Ambulatory Visit (INDEPENDENT_AMBULATORY_CARE_PROVIDER_SITE_OTHER): Payer: Medicare Other | Admitting: Internal Medicine

## 2012-07-24 ENCOUNTER — Telehealth: Payer: Self-pay | Admitting: Internal Medicine

## 2012-07-24 ENCOUNTER — Encounter: Payer: Self-pay | Admitting: Internal Medicine

## 2012-07-24 VITALS — BP 132/80 | HR 82 | Temp 98.2°F | Resp 16 | Wt 159.8 lb

## 2012-07-24 DIAGNOSIS — N390 Urinary tract infection, site not specified: Secondary | ICD-10-CM

## 2012-07-24 DIAGNOSIS — R300 Dysuria: Secondary | ICD-10-CM

## 2012-07-24 LAB — POCT URINALYSIS DIPSTICK
Bilirubin, UA: NEGATIVE
Glucose, UA: NEGATIVE
Spec Grav, UA: 1.015
Urobilinogen, UA: 0.2

## 2012-07-24 MED ORDER — CEPHALEXIN 500 MG PO CAPS: 500.0000 mg | ORAL_CAPSULE | Freq: Three times a day (TID) | ORAL | Status: DC

## 2012-07-24 NOTE — Telephone Encounter (Signed)
This pt also may be able to see Raquel today. Will need to check with her.

## 2012-07-24 NOTE — Assessment & Plan Note (Signed)
Symptoms are consistent with urinary tract infection. Urinalysis remarkable for blood and leukocytes. Will send urine for culture. Will treat with Keflex 500 mg 3 times daily x7 days. Patient will use Pyridium as needed for bladder spasm. Followup if symptoms are not improving over next 48hr.

## 2012-07-24 NOTE — Progress Notes (Signed)
  Subjective:    Patient ID: Destiny Burns, female    DOB: 10/08/1926, 78 y.o.   MRN: [id number]  HPI 78 year old female presents for acute visit complaining of two-day history of mild urinary frequency, dysuria, and generally not feeling well. She denies hematuria, flank pain, fever, chills. She has not taken any medication for her symptoms.  Outpatient Encounter Prescriptions as of 07/24/2012  Medication Sig Dispense Refill  . aspirin 81 MG tablet Take 81 mg by mouth daily.        Marland Kitchen CALCIUM PO Take 1 tablet by mouth 3 (three) times daily.        . cephALEXin (KEFLEX) 500 MG capsule Take 1 capsule (500 mg total) by mouth 3 (three) times daily.  21 capsule  0  . cholecalciferol (VITAMIN D) 1000 UNITS tablet Take 1,000 Units by mouth daily.        . fluconazole (DIFLUCAN) 150 MG tablet Take 1 tablet (150 mg total) by mouth daily.  3 tablet  0  . hydrochlorothiazide (HYDRODIURIL) 25 MG tablet Take 1 tablet (25 mg total) by mouth daily.  90 tablet  4  . levothyroxine (SYNTHROID, LEVOTHROID) 25 MCG tablet Take 1 tablet (25 mcg total) by mouth daily.  90 tablet  4  . loratadine (CLARITIN) 10 MG tablet Take 10 mg by mouth daily.        . [DISCONTINUED] cephALEXin (KEFLEX) 500 MG capsule Take 1 capsule (500 mg total) by mouth 3 (three) times daily.  21 capsule  0   BP 132/80  Pulse 82  Temp 98.2 F (36.8 C) (Oral)  Resp 16  Wt 159 lb 12 oz (72.462 kg)  Review of Systems  Constitutional: Negative for fever, chills and fatigue.  Gastrointestinal: Negative for nausea, vomiting, abdominal pain, diarrhea, constipation and rectal pain.  Genitourinary: Positive for dysuria, urgency and frequency. Negative for hematuria, flank pain, decreased urine volume, vaginal bleeding, vaginal discharge, difficulty urinating, vaginal pain and pelvic pain.       Objective:   Physical Exam  Constitutional: She is oriented to person, place, and time. She appears well-developed and well-nourished. No distress.   HENT:  Head: Normocephalic and atraumatic.  Right Ear: External ear normal.  Left Ear: External ear normal.  Nose: Nose normal.  Mouth/Throat: Oropharynx is clear and moist. No oropharyngeal exudate.  Eyes: Conjunctivae normal are normal. Pupils are equal, round, and reactive to light. Right eye exhibits no discharge. Left eye exhibits no discharge. No scleral icterus.  Neck: Normal range of motion. Neck supple. No tracheal deviation present. No thyromegaly present.  Cardiovascular: Normal rate, regular rhythm, normal heart sounds and intact distal pulses.  Exam reveals no gallop and no friction rub.   No murmur heard. Pulmonary/Chest: Effort normal and breath sounds normal. No respiratory distress. She has no wheezes. She has no rales. She exhibits no tenderness.  Abdominal: There is no tenderness (no CVA tenderness).  Musculoskeletal: Normal range of motion. She exhibits no edema and no tenderness.  Lymphadenopathy:    She has no cervical adenopathy.  Neurological: She is alert and oriented to person, place, and time. No cranial nerve deficit. She exhibits normal muscle tone. Coordination normal.  Skin: Skin is warm and dry. No rash noted. She is not diaphoretic. No erythema. No pallor.  Psychiatric: She has a normal mood and affect. Her behavior is normal. Judgment and thought content normal.          Assessment & Plan:

## 2012-07-24 NOTE — Telephone Encounter (Signed)
Patient Information:  Caller Name: Destiny Burns  Phone: [phone]  Patient: Destiny Burns, Destiny Burns  Gender: Female  DOB: Jun 12, 1927  Age: 78 Years  PCP: Ronna Polio (Adults only)  Office Follow Up:  Does the office need to follow up with this patient?: No  Instructions For The Office: N/A   Symptoms  Reason For Call & Symptoms: Patient states she has a UTI. Onset of symptoms 07/22/12. +Frequency, +urgency, +urine cloudy, Incontinence yesterday. Feeling uncomfortable in abdomen. No n/v/d.  Legs feel week at times.  Reviewed Health History In EMR: Yes  Reviewed Medications In EMR: Yes  Reviewed Allergies In EMR: Yes  Reviewed Surgeries / Procedures: No  Date of Onset of Symptoms: 07/22/2012  Treatments Tried: Cranberry juice and pill  Treatments Tried Worked: No  Guideline(s) Used:  Urination Pain - Female  Disposition Per Guideline:   See Today in Office  Reason For Disposition Reached:   Age > 50 years  Advice Given:  Fluids:   Drink extra fluids. Drink 8-10 glasses of liquids a day (Reason: to produce a dilute, non-irritating urine).  Cranberry Juice:   Some people think that drinking cranberry juice may help in fighting urinary tract infections. However, there is no good research that has ever proved this.  Dosage Cranberry Juice Cocktail: 8 oz (240 ml) twice a day.  Dosage 100% Cranberry Juice: 1 oz (30 ml) twice a day.  Warm Saline SITZ Baths to Reduce Pain:  Sit in a warm saline bath for 20 minutes to cleanse the area and to reduce pain. Add 2 oz. of table salt or baking soda to a tub of water.  Call Back If:  You become worse.  Appointment Scheduled:  07/24/2012 16:15:00 Appointment Scheduled Provider:  Ronna Polio (Adults only)

## 2012-07-27 LAB — URINE CULTURE: Colony Count: 35000

## 2012-10-02 ENCOUNTER — Telehealth: Payer: Self-pay | Admitting: Internal Medicine

## 2012-10-02 NOTE — Telephone Encounter (Signed)
Patient called stating she has a bladder infection, urine is cloudy and running to the bathroom all the time and uncomfortable. Same as always, just like last time. Would like to know if you would call something in for her.

## 2012-10-02 NOTE — Telephone Encounter (Signed)
She needs to be seen in an urgent visit tomorrow.

## 2012-10-02 NOTE — Telephone Encounter (Signed)
Pt asking for another prescription, states she is getting another bladder infection.  Wants Dr. Dan Humphreys to call her in something.  Please advise.

## 2012-10-03 ENCOUNTER — Encounter: Payer: Self-pay | Admitting: Adult Health

## 2012-10-03 ENCOUNTER — Ambulatory Visit (INDEPENDENT_AMBULATORY_CARE_PROVIDER_SITE_OTHER): Payer: Medicare Other | Admitting: Adult Health

## 2012-10-03 VITALS — BP 102/60 | HR 76 | Temp 98.0°F | Wt 160.0 lb

## 2012-10-03 DIAGNOSIS — N390 Urinary tract infection, site not specified: Secondary | ICD-10-CM

## 2012-10-03 LAB — POCT URINALYSIS DIPSTICK
Protein, UA: NEGATIVE
Spec Grav, UA: 1.01
Urobilinogen, UA: 0.2

## 2012-10-03 MED ORDER — CIPROFLOXACIN HCL 250 MG PO TABS: 250.0000 mg | ORAL_TABLET | Freq: Two times a day (BID) | ORAL | Status: DC

## 2012-10-03 NOTE — Progress Notes (Signed)
  Subjective:    Patient ID: Destiny Burns, female    DOB: 02-24-1927, 79 y.o.   MRN: [id number]  HPI  Patient presents to clinic today with s/s of dysuria and flank pain. She reports that she has been recently treated for UTI in December. Denies fever or chills.  Current Outpatient Prescriptions on File Prior to Visit  Medication Sig Dispense Refill  . aspirin 81 MG tablet Take 81 mg by mouth daily.        Marland Kitchen CALCIUM PO Take 1 tablet by mouth 3 (three) times daily.        . cholecalciferol (VITAMIN D) 1000 UNITS tablet Take 1,000 Units by mouth daily.        . hydrochlorothiazide (HYDRODIURIL) 25 MG tablet Take 1 tablet (25 mg total) by mouth daily.  90 tablet  4  . levothyroxine (SYNTHROID, LEVOTHROID) 25 MCG tablet Take 1 tablet (25 mcg total) by mouth daily.  90 tablet  4  . loratadine (CLARITIN) 10 MG tablet Take 10 mg by mouth daily.        . cephALEXin (KEFLEX) 500 MG capsule Take 1 capsule (500 mg total) by mouth 3 (three) times daily.  21 capsule  0  . fluconazole (DIFLUCAN) 150 MG tablet Take 1 tablet (150 mg total) by mouth daily.  3 tablet  0   No current facility-administered medications on file prior to visit.     Review of Systems  Constitutional: Negative for fever and chills.  Genitourinary: Positive for dysuria and flank pain. Negative for hematuria.    BP 102/60  Pulse 76  Temp(Src) 98 F (36.7 C) (Oral)  Wt 160 lb (72.576 kg)  BMI 27.89 kg/m2  SpO2 97%     Objective:   Physical Exam  Constitutional: She is oriented to person, place, and time. She appears well-developed and well-nourished.  Cardiovascular: Normal rate and regular rhythm.   Pulmonary/Chest: Effort normal and breath sounds normal.  Abdominal: Soft. Bowel sounds are normal.  Genitourinary:  Urine dipstick showing large amount of leukocytes.  Neurological: She is alert and oriented to person, place, and time.  Skin: Skin is warm and dry.  Psychiatric: She has a normal mood and affect. Her  behavior is normal. Thought content normal.       Assessment & Plan:

## 2012-10-03 NOTE — Telephone Encounter (Signed)
Patient agreed to see Raquel today at 145

## 2012-10-03 NOTE — Addendum Note (Signed)
Addended by: Jackson Latino on: 10/03/2012 02:57 PM   Modules accepted: Orders

## 2012-10-03 NOTE — Patient Instructions (Addendum)
  Start your antibiotic today. You will take this for 5 days. Do not take the calcium while on Cipro. Resume the calcium tablets once you have finished your antibiotic.  Continue to drink fluids to stay hydrated.   Below is some information on urinary tract infections:  What is the urinary tract? - The urinary tract is the group of organs in the body that handle urine. The urinary tract includes the:   Kidneys, two bean-shaped organs that filter the blood to make urine  Bladder, a balloon-shaped organ that stores urine  Ureters, two tubes that carry urine from the kidneys to the bladder  Urethra, the tube that carries urine from the bladder to the outside of the body   What are urinary tract infections? - Urinary tract infections, also called "UTIs," are infections that affect either the bladder or the kidneys. Bladder infections are more common than kidney infections. Bladder infections happen when bacteria get into the urethra and travel up into the bladder. Kidney infections happen when the bacteria travel even higher, up into the kidneys. Both bladder and kidney infections are more common in women than men.   What are the symptoms of a bladder infection? - The symptoms include:   Pain or a burning feeling when you urinate  The need to urinate often  The need to urinate suddenly or in a hurry  Blood in the urine   What are the symptoms of a kidney infection? - The symptoms of a kidney infection can include the symptoms of a bladder infection, but kidney infections can also cause:   Fever  Back pain  Nausea or vomiting   How are urinary tract infections treated? - Most urinary tract infections are treated with antibiotic pills. These pills work by killing the germs that cause the infection.  If you have a bladder infection, you will probably need to take antibiotics for 3 to 7 days. If you have a kidney infection, you will probably need to take antibiotics for longer-maybe for up to  2 weeks. If you have a kidney infection, it's also possible you will need to be treated in the hospital.   Your symptoms should begin to improve within a day of starting antibiotics. But you should finish all the antibiotic pills you get. Otherwise your infection might come back.  If needed, you can also take a medicine to numb your bladder such as AZO or Urostat which are sold over the counter.   Can cranberry juice or other cranberry products prevent bladder infections? - The studies suggesting that cranberry products prevent bladder infections are not very good. But if you want to try cranberry products for this purpose, there is probably not much harm in doing so.

## 2012-10-06 ENCOUNTER — Other Ambulatory Visit: Payer: Self-pay | Admitting: *Deleted

## 2012-10-06 DIAGNOSIS — N390 Urinary tract infection, site not specified: Secondary | ICD-10-CM

## 2012-10-15 ENCOUNTER — Ambulatory Visit: Payer: Medicare Other | Admitting: Adult Health

## 2012-11-13 ENCOUNTER — Encounter: Payer: Self-pay | Admitting: Adult Health

## 2012-11-13 ENCOUNTER — Ambulatory Visit (INDEPENDENT_AMBULATORY_CARE_PROVIDER_SITE_OTHER): Payer: Medicare Other | Admitting: Adult Health

## 2012-11-13 ENCOUNTER — Telehealth: Payer: Self-pay | Admitting: General Practice

## 2012-11-13 ENCOUNTER — Ambulatory Visit: Payer: Self-pay | Admitting: Internal Medicine

## 2012-11-13 VITALS — BP 169/77 | HR 66 | Temp 98.2°F

## 2012-11-13 DIAGNOSIS — S0990XA Unspecified injury of head, initial encounter: Secondary | ICD-10-CM

## 2012-11-13 NOTE — Progress Notes (Signed)
  Subjective:    Patient ID: Destiny Burns, female    DOB: 1926-12-01, 79 y.o.   MRN: [id number]  HPI  Patient is a pleasant 79 y/o female who was at [employer] for lunch. She was going to her car and did not notice a step down. She fell and hit the right side of her head and face on the cement. She reports not loosing consciousness. She denies headache, dizziness, weakness.  Review of Systems  HENT:       Cut on the side of her right eye. Abrasion on right side of head and forehead  Neurological: Negative for dizziness, tremors, syncope, weakness, numbness and headaches.  Psychiatric/Behavioral: Negative.     BP 169/77  Pulse 66  Temp(Src) 98.2 F (36.8 C) (Oral)  SpO2 96%    Objective:   Physical Exam  Constitutional: She is oriented to person, place, and time.  Cardiovascular: Normal rate.   Pulmonary/Chest: Effort normal.  Neurological: She is alert and oriented to person, place, and time. No cranial nerve deficit or sensory deficit. She exhibits normal muscle tone. Coordination normal.       Assessment & Plan:

## 2012-11-13 NOTE — Telephone Encounter (Signed)
Pt is on the schedule for an appt with Raquel at 3:30pm. Pt states that she was at [employer] and fell while walking to her car. Pt has a contusion, cut, and swelling to the right portion of her cheekbone and eye. States that it does not hurt. Eyes looked normal and when CMA left pt was talking to another pt in the lobby.  Complains of soreness to her right ankle.

## 2012-11-13 NOTE — Assessment & Plan Note (Signed)
Patient is s/p fall on concrete today with injury to the right side of her eye and head. There is an abrasion on right temporal area and up higher. She has a small laceration which she thinks it was 2/2 her eyeglasses crushing against her face. There was no loss of consciousness. Patient has a normal neuro exam. I am sending her for a CT of head to r/o bleeding.

## 2012-11-14 ENCOUNTER — Encounter: Payer: Self-pay | Admitting: Adult Health

## 2012-11-14 ENCOUNTER — Ambulatory Visit (INDEPENDENT_AMBULATORY_CARE_PROVIDER_SITE_OTHER): Payer: Medicare Other | Admitting: Adult Health

## 2012-11-14 ENCOUNTER — Telehealth: Payer: Self-pay | Admitting: Internal Medicine

## 2012-11-14 VITALS — BP 136/70 | HR 81 | Temp 100.5°F | Resp 14

## 2012-11-14 DIAGNOSIS — R21 Rash and other nonspecific skin eruption: Secondary | ICD-10-CM

## 2012-11-14 MED ORDER — PREDNISONE 10 MG PO TABS: ORAL_TABLET | ORAL | Status: DC

## 2012-11-14 MED ORDER — CALAMINE EX LOTN: TOPICAL_LOTION | CUTANEOUS | Status: DC | PRN

## 2012-11-14 NOTE — Assessment & Plan Note (Signed)
Suspect that this is secondary to coming in contact with allergen while working out in the yard. Start prednisone taper with 60 mg decreasing by 10 mg daily until done. Apply calamine lotion as needed.

## 2012-11-14 NOTE — Patient Instructions (Addendum)
  I have prescribing a prednisone taper for your rash. You will take this medication as follows:  Day 1 - 6 tablets  Day 2 - 5 tablets  Day 3 - 4 tablets  Day 4 - 3 tablets  Day 5 - 2 tablets  Day 6 - 1 tablet . You will be finished.   Apply the calamine lotion as needed to the affected area.

## 2012-11-14 NOTE — Progress Notes (Signed)
  Subjective:    Patient ID: Destiny Burns, female    DOB: December 05, 1926, 79 y.o.   MRN: [id number]  HPI  Destiny Burns is a pleasant 79 year old female who presents to clinic with a rash on bilateral lower extremities. The right lower extremity rash is worse than the left. Patient reports that she has been out in the yard working. She is wondering if she came in contact with some poison oak. However, she denies having any itching. No other symptoms reported.  Current Outpatient Prescriptions on File Prior to Visit  Medication Sig Dispense Refill  . aspirin 81 MG tablet Take 81 mg by mouth daily.        Marland Kitchen CALCIUM PO Take 1 tablet by mouth 3 (three) times daily.        . cholecalciferol (VITAMIN D) 1000 UNITS tablet Take 1,000 Units by mouth daily.        . hydrochlorothiazide (HYDRODIURIL) 25 MG tablet Take 1 tablet (25 mg total) by mouth daily.  90 tablet  4  . levothyroxine (SYNTHROID, LEVOTHROID) 25 MCG tablet Take 1 tablet (25 mcg total) by mouth daily.  90 tablet  4  . loratadine (CLARITIN) 10 MG tablet Take 10 mg by mouth daily.         No current facility-administered medications on file prior to visit.    Review of Systems  Skin: Positive for rash.       Objective:   Physical Exam  Constitutional: She is oriented to person, place, and time. She appears well-developed and well-nourished. No distress.  Neurological: She is alert and oriented to person, place, and time.  Skin: Skin is warm and dry. Rash noted.  Bilateral LE rash. Right lower extremity erythematous, flat rash greater than left lower extremity. The rash appears to be spreading up into the thigh area. There are no secondary lesions noted. There is no appearance of infection. There is no drainage or pustules noted.  Psychiatric: She has a normal mood and affect. Her behavior is normal. Judgment and thought content normal.        Assessment & Plan:

## 2012-11-14 NOTE — Telephone Encounter (Signed)
Patient Information:  Caller Name: Teya  Phone: [phone]  Patient: Destiny Burns, Destiny Burns  Gender: Female  DOB: Mar 26, 1927  Age: 79 Years  PCP: Ronna Polio (Adults only)  Office Follow Up:  Does the office need to follow up with this patient?: No  Instructions For The Office: N/A   Symptoms  Reason For Call & Symptoms: Pt fell yesterday outside and was seen at the office after hitting her head. Pt had a scan which showed no injury. Pt wears support hose and pt states no one removed them to examine her legs. The pt states her legs always bother her so she did not realize that her legs were sore. She had hives all over the ankles spreading up to her knee. She thinks she got into poison ivy.  Reviewed Health History In EMR: Yes  Reviewed Medications In EMR: Yes  Reviewed Allergies In EMR: Yes  Reviewed Surgeries / Procedures: Yes  Date of Onset of Symptoms: 11/14/2012  Guideline(s) Used:  Hives  Disposition Per Guideline:   See Today in Office  Reason For Disposition Reached:   Moderate-Severe hives persist (i.e., hives interfere with normal activities or work) and taking antihistamine (e.g., Benadryl, Claritin) > 24 hours  Advice Given:  N/A  Patient Will Follow Care Advice:  YES  Appointment Scheduled:  11/14/2012 13:30:00 Appointment Scheduled Provider:  Orville Govern

## 2012-11-27 ENCOUNTER — Encounter: Payer: Self-pay | Admitting: Internal Medicine

## 2012-12-11 ENCOUNTER — Ambulatory Visit: Payer: Medicare Other | Admitting: Adult Health

## 2013-01-12 ENCOUNTER — Other Ambulatory Visit: Payer: Self-pay | Admitting: *Deleted

## 2013-01-12 NOTE — Telephone Encounter (Signed)
Refill

## 2013-01-13 ENCOUNTER — Ambulatory Visit (INDEPENDENT_AMBULATORY_CARE_PROVIDER_SITE_OTHER): Payer: Medicare Other | Admitting: Adult Health

## 2013-01-13 ENCOUNTER — Encounter: Payer: Self-pay | Admitting: Adult Health

## 2013-01-13 VITALS — BP 112/60 | HR 72 | Temp 98.2°F | Resp 12 | Wt 159.0 lb

## 2013-01-13 DIAGNOSIS — R2689 Other abnormalities of gait and mobility: Secondary | ICD-10-CM

## 2013-01-13 DIAGNOSIS — E860 Dehydration: Secondary | ICD-10-CM

## 2013-01-13 DIAGNOSIS — R29818 Other symptoms and signs involving the nervous system: Secondary | ICD-10-CM

## 2013-01-13 LAB — BASIC METABOLIC PANEL
BUN: 18 mg/dL (ref 6–23)
CO2: 30 mEq/L (ref 19–32)
Chloride: 101 mEq/L (ref 96–112)
Glucose, Bld: 96 mg/dL (ref 70–99)
Potassium: 3.9 mEq/L (ref 3.5–5.1)
Sodium: 135 mEq/L (ref 135–145)

## 2013-01-13 MED ORDER — LORAZEPAM 0.5 MG PO TABS: 0.5000 mg | ORAL_TABLET | Freq: Three times a day (TID) | ORAL | Status: AC | PRN

## 2013-01-13 NOTE — Patient Instructions (Addendum)
  There are some medications that may cause drowsiness and increased possibility of falls.  One of these medications is Ativan which she used to take up to 3 times a day as needed for anxiety.  Please take the medication if you need it; however, use caution when you get up during the night.  Do not get up quickly but rather sit for a moment prior to standing up and moving around.  I am also checking a metabolic panel today because dehydration may also cause some of the symptoms that you are experiencing. Once the results are available the office will contact you.

## 2013-01-13 NOTE — Telephone Encounter (Signed)
Rx faxed to pharmacy  

## 2013-01-14 ENCOUNTER — Encounter: Payer: Self-pay | Admitting: Adult Health

## 2013-01-14 DIAGNOSIS — R2689 Other abnormalities of gait and mobility: Secondary | ICD-10-CM | POA: Insufficient documentation

## 2013-01-14 NOTE — Progress Notes (Signed)
  Subjective:    Patient ID: Destiny Burns, female    DOB: 12-25-1926, 79 y.o.   MRN: [id number]  HPI  Patient is a pleasant 79 year old female with history of hypertension, hyperlipidemia, hyperthyroidism and history of recent falls. Patient presents to clinic with reports that she once again fell Tuesday night when she was getting up to go to the bathroom. She reports hitting her chin and right elbow. She reports most of these falls are occurring when she gets up in the middle of the night. Denies weakness, fatigue. Patient lives alone. She reports taking Ativan 0.5 mg mainly at bedtime to help her sleep. No other changes in medications reported. She is inquiring as to what can be causing her to be losing her balance.  Current Outpatient Prescriptions on File Prior to Visit  Medication Sig Dispense Refill  . aspirin 81 MG tablet Take 81 mg by mouth daily.        Marland Kitchen CALCIUM PO Take 1 tablet by mouth 3 (three) times daily.        . cholecalciferol (VITAMIN D) 1000 UNITS tablet Take 1,000 Units by mouth daily.        . hydrochlorothiazide (HYDRODIURIL) 25 MG tablet Take 1 tablet (25 mg total) by mouth daily.  90 tablet  4  . levothyroxine (SYNTHROID, LEVOTHROID) 25 MCG tablet Take 1 tablet (25 mcg total) by mouth daily.  90 tablet  4  . loratadine (CLARITIN) 10 MG tablet Take 10 mg by mouth daily.        Marland Kitchen LORazepam (ATIVAN) 0.5 MG tablet Take 1 tablet (0.5 mg total) by mouth 3 (three) times daily as needed for anxiety.  90 tablet  1   No current facility-administered medications on file prior to visit.     Review of Systems  Constitutional: Negative.   HENT: Negative.   Respiratory: Negative.   Cardiovascular: Negative.   Gastrointestinal: Negative.   Musculoskeletal:       Loss of balance, mainly during the night  Neurological: Negative for dizziness, syncope, weakness, light-headedness, numbness and headaches.  Psychiatric/Behavioral: Negative.      BP 112/60  Pulse 72   Temp(Src) 98.2 F (36.8 C) (Oral)  Resp 12  Wt 159 lb (72.122 kg)  BMI 27.72 kg/m2  SpO2 97%    Objective:   Physical Exam  Constitutional: She is oriented to person, place, and time. She appears well-developed and well-nourished. No distress.  Cardiovascular: Normal rate, regular rhythm and normal heart sounds.  Exam reveals no gallop and no friction rub.   No murmur heard. Pulmonary/Chest: Effort normal and breath sounds normal. No respiratory distress. She has no wheezes.  Abdominal: Soft. Bowel sounds are normal.  Musculoskeletal: She exhibits no edema and no tenderness.  Small ecchymosis under her chin without swelling. Ecchymosis on the right elbow without swelling. Steady gait.  Neurological: She is alert and oriented to person, place, and time. No cranial nerve deficit. Coordination normal.  Patient has 5/5 strength bilateral upper extremities and lower extremity. Excellent resistance against gravity. Strong grips.  Skin: Skin is warm and dry.  Psychiatric: She has a normal mood and affect. Her behavior is normal. Judgment and thought content normal.          Assessment & Plan:

## 2013-01-14 NOTE — Assessment & Plan Note (Signed)
Physical exam normal including vital signs. Patient reports she has been taking Ativan at bedtime "because this helps her sleep better then sleepy time tea". Patient reports incidents of loss of balance as occurring during the night. discuss possibility that this is secondary to the Ativan. Encouraged her to go back to sleepy time tea and to evaluate if problem resolves. If still having trouble sleeping, may need to take half of the Ativan dose. Patient was also instructed to change positions slowly. Check bmet for alteration in electrolyte or dehydration.

## 2013-03-04 ENCOUNTER — Telehealth: Payer: Self-pay | Admitting: Internal Medicine

## 2013-03-04 NOTE — Telephone Encounter (Signed)
Requesting a handicap sticker, ok with you?

## 2013-03-04 NOTE — Telephone Encounter (Signed)
Patient asking if you can help her get a handicap sign for her car.

## 2013-03-04 NOTE — Telephone Encounter (Signed)
Fine to order a handicap sticker

## 2013-03-05 NOTE — Telephone Encounter (Signed)
Patient informed, form completed for her to pick up at front desk.

## 2013-06-15 ENCOUNTER — Other Ambulatory Visit: Payer: Self-pay | Admitting: *Deleted

## 2013-06-15 DIAGNOSIS — E039 Hypothyroidism, unspecified: Secondary | ICD-10-CM

## 2013-06-15 MED ORDER — LEVOTHYROXINE SODIUM 25 MCG PO TABS: 25.0000 ug | ORAL_TABLET | Freq: Every day | ORAL | Status: DC

## 2013-06-25 ENCOUNTER — Ambulatory Visit: Payer: Self-pay | Admitting: Internal Medicine

## 2013-06-25 LAB — HM MAMMOGRAPHY

## 2013-07-17 ENCOUNTER — Other Ambulatory Visit: Payer: Self-pay | Admitting: *Deleted

## 2013-07-17 ENCOUNTER — Other Ambulatory Visit: Payer: Self-pay | Admitting: Internal Medicine

## 2013-07-17 DIAGNOSIS — E039 Hypothyroidism, unspecified: Secondary | ICD-10-CM

## 2013-07-17 DIAGNOSIS — I10 Essential (primary) hypertension: Secondary | ICD-10-CM

## 2013-07-17 MED ORDER — HYDROCHLOROTHIAZIDE 25 MG PO TABS: 25.0000 mg | ORAL_TABLET | Freq: Every day | ORAL | Status: DC

## 2013-07-17 MED ORDER — LEVOTHYROXINE SODIUM 25 MCG PO TABS: 25.0000 ug | ORAL_TABLET | Freq: Every day | ORAL | Status: DC

## 2013-07-17 NOTE — Telephone Encounter (Signed)
May refill x 1 month, then needs follow up visit. 

## 2013-07-17 NOTE — Telephone Encounter (Signed)
Last non-acute visit was 09/05/11, refill?

## 2013-07-31 ENCOUNTER — Other Ambulatory Visit: Payer: Self-pay | Admitting: Internal Medicine

## 2013-07-31 NOTE — Telephone Encounter (Signed)
Rx faxed to pharmacy  

## 2013-08-14 ENCOUNTER — Encounter: Payer: Self-pay | Admitting: *Deleted

## 2013-08-17 ENCOUNTER — Ambulatory Visit: Payer: Medicare Other | Admitting: Internal Medicine

## 2013-08-17 ENCOUNTER — Telehealth: Payer: Self-pay | Admitting: Internal Medicine

## 2013-08-17 NOTE — Telephone Encounter (Signed)
Pt came in today and stated she needed a referral for [facility]  Her appointment is 09/02/13 Card scanned in system

## 2013-08-18 NOTE — Telephone Encounter (Signed)
[facility] referral is underway

## 2013-08-19 ENCOUNTER — Other Ambulatory Visit: Payer: Self-pay | Admitting: Internal Medicine

## 2013-08-19 NOTE — Telephone Encounter (Signed)
Appt 06/15/14 

## 2013-08-21 ENCOUNTER — Other Ambulatory Visit: Payer: Self-pay | Admitting: Internal Medicine

## 2013-08-24 ENCOUNTER — Encounter: Payer: Self-pay | Admitting: *Deleted

## 2013-08-25 ENCOUNTER — Ambulatory Visit (INDEPENDENT_AMBULATORY_CARE_PROVIDER_SITE_OTHER): Payer: Medicare HMO | Admitting: Internal Medicine

## 2013-08-25 ENCOUNTER — Encounter: Payer: Self-pay | Admitting: Internal Medicine

## 2013-08-25 VITALS — BP 128/68 | HR 67 | Temp 98.3°F | Wt 159.0 lb

## 2013-08-25 DIAGNOSIS — I10 Essential (primary) hypertension: Secondary | ICD-10-CM

## 2013-08-25 DIAGNOSIS — L820 Inflamed seborrheic keratosis: Secondary | ICD-10-CM | POA: Insufficient documentation

## 2013-08-25 DIAGNOSIS — E039 Hypothyroidism, unspecified: Secondary | ICD-10-CM

## 2013-08-25 LAB — HM PAP SMEAR

## 2013-08-25 NOTE — Progress Notes (Signed)
Pre-visit discussion using our clinic review tool. No additional management support is needed unless otherwise documented below in the visit note.  

## 2013-08-26 LAB — COMPREHENSIVE METABOLIC PANEL
ALT: 18 U/L (ref 0–35)
AST: 40 U/L — ABNORMAL HIGH (ref 0–37)
Albumin: 3.8 g/dL (ref 3.5–5.2)
Alkaline Phosphatase: 37 U/L — ABNORMAL LOW (ref 39–117)
BILIRUBIN TOTAL: 0.6 mg/dL (ref 0.3–1.2)
BUN: 18 mg/dL (ref 6–23)
CALCIUM: 9.4 mg/dL (ref 8.4–10.5)
CHLORIDE: 103 meq/L (ref 96–112)
CO2: 31 meq/L (ref 19–32)
Creatinine, Ser: 0.8 mg/dL (ref 0.4–1.2)
GFR: 73.52 mL/min (ref >60.00)
Glucose, Bld: 116 mg/dL — ABNORMAL HIGH (ref 70–99)
Potassium: 4.1 mEq/L (ref 3.5–5.1)
SODIUM: 139 meq/L (ref 135–145)
TOTAL PROTEIN: 6.3 g/dL (ref 6.0–8.3)

## 2013-08-26 LAB — URINALYSIS, ROUTINE W REFLEX MICROSCOPIC
Bilirubin Urine: NEGATIVE
Hgb urine dipstick: NEGATIVE
Ketones, ur: NEGATIVE
Nitrite: NEGATIVE
SPECIFIC GRAVITY, URINE: 1.01 (ref 1.000–1.030)
Total Protein, Urine: NEGATIVE
URINE GLUCOSE: NEGATIVE
UROBILINOGEN UA: 0.2 (ref 0.0–1.0)
pH: 7 (ref 5.0–8.0)

## 2013-08-26 LAB — TSH: TSH: 2.44 u[IU]/mL (ref 0.35–5.50)

## 2013-08-26 LAB — MICROALBUMIN / CREATININE URINE RATIO
Creatinine,U: 36.9 mg/dL
Microalb Creat Ratio: 1.4 mg/g (ref 0.0–30.0)
Microalb, Ur: 0.5 mg/dL (ref 0.0–1.9)

## 2013-08-26 NOTE — Assessment & Plan Note (Signed)
Skin lesion on right cheek most consistent with inflamed SK. Will set up dermatology evaluation for possible biopsy.

## 2013-08-26 NOTE — Progress Notes (Signed)
Subjective:    Patient ID: Destiny Burns, female    DOB: 1926/12/31, 80 y.o.   MRN: [id number]  HPI 80 year old female with history of hypertension, hypothyroidism presents for followup. She reports she is generally feeling well. Her concern today is a skin lesion on her right posterior cheek. She is not sure how long this has been there. It occasionally peels. It is not painful. It does not seem to be changing in size. Aside from this, she is doing well. She is compliant with medications. She does not check her blood pressure. She denies chest pain, headache, palpitations.  Outpatient Encounter Prescriptions as of 08/25/2013  Medication Sig  . aspirin 81 MG tablet Take 81 mg by mouth daily.    Marland Kitchen CALCIUM PO Take 1 tablet by mouth 3 (three) times daily.    . cholecalciferol (VITAMIN D) 1000 UNITS tablet Take 1,000 Units by mouth daily.    . hydrochlorothiazide (HYDRODIURIL) 25 MG tablet Take 1 tablet (25 mg total) by mouth daily.  Marland Kitchen levothyroxine (SYNTHROID, LEVOTHROID) 25 MCG tablet TAKE ONE (1) TABLET BY MOUTH EVERY DAY  . loratadine (CLARITIN) 10 MG tablet Take 10 mg by mouth daily.    Marland Kitchen LORazepam (ATIVAN) 0.5 MG tablet TAKE ONE TABLET BY MOUTH THREE TIMES DAILY AS NEEDED   BP 128/68  Pulse 67  Temp(Src) 98.3 F (36.8 C) (Oral)  Wt 159 lb (72.122 kg)  SpO2 97%  Review of Systems  Constitutional: Negative for fever, chills, appetite change, fatigue and unexpected weight change.  HENT: Negative for congestion, ear pain, sinus pressure, sore throat, trouble swallowing and voice change.   Eyes: Negative for visual disturbance.  Respiratory: Negative for cough, shortness of breath, wheezing and stridor.   Cardiovascular: Negative for chest pain, palpitations and leg swelling.  Gastrointestinal: Negative for nausea, vomiting, abdominal pain, diarrhea, constipation, blood in stool, abdominal distention and anal bleeding.  Genitourinary: Negative for dysuria and flank pain.    Musculoskeletal: Negative for arthralgias, gait problem, myalgias and neck pain.  Skin: Negative for color change and rash.  Neurological: Negative for dizziness and headaches.  Hematological: Negative for adenopathy. Does not bruise/bleed easily.  Psychiatric/Behavioral: Negative for suicidal ideas, sleep disturbance and dysphoric mood. The patient is not nervous/anxious.        Objective:   Physical Exam  Constitutional: She is oriented to person, place, and time. She appears well-developed and well-nourished. No distress.  HENT:  Head: Normocephalic and atraumatic.  Right Ear: External ear normal.  Left Ear: External ear normal.  Nose: Nose normal.  Mouth/Throat: Oropharynx is clear and moist. No oropharyngeal exudate.  Eyes: Conjunctivae are normal. Pupils are equal, round, and reactive to light. Right eye exhibits no discharge. Left eye exhibits no discharge. No scleral icterus.  Neck: Normal range of motion. Neck supple. No tracheal deviation present. No thyromegaly present.  Cardiovascular: Normal rate, regular rhythm, normal heart sounds and intact distal pulses.  Exam reveals no gallop and no friction rub.   No murmur heard. Pulmonary/Chest: Effort normal and breath sounds normal. No accessory muscle usage. Not tachypneic. No respiratory distress. She has no decreased breath sounds. She has no wheezes. She has no rhonchi. She has no rales. She exhibits no tenderness.  Musculoskeletal: Normal range of motion. She exhibits no edema and no tenderness.  Lymphadenopathy:    She has no cervical adenopathy.  Neurological: She is alert and oriented to person, place, and time. No cranial nerve deficit. She exhibits normal muscle tone. Coordination normal.  Skin: Skin is warm and dry. Lesion noted. No rash noted. She is not diaphoretic. No erythema. No pallor.     Psychiatric: She has a normal mood and affect. Her behavior is normal. Judgment and thought content normal.           Assessment & Plan:

## 2013-08-26 NOTE — Assessment & Plan Note (Signed)
Will check TSH with labs today. Continue Levothyroxine. 

## 2013-08-26 NOTE — Assessment & Plan Note (Signed)
BP Readings from Last 3 Encounters:  08/25/13 128/68  01/13/13 112/60  11/14/12 136/70   Will check renal function with labs. Continue HCTZ.

## 2013-08-27 ENCOUNTER — Other Ambulatory Visit: Payer: Self-pay | Admitting: *Deleted

## 2013-08-27 DIAGNOSIS — I10 Essential (primary) hypertension: Secondary | ICD-10-CM

## 2013-08-27 MED ORDER — HYDROCHLOROTHIAZIDE 25 MG PO TABS: 25.0000 mg | ORAL_TABLET | Freq: Every day | ORAL | Status: DC

## 2013-08-27 MED ORDER — LEVOTHYROXINE SODIUM 25 MCG PO TABS: ORAL_TABLET | ORAL | Status: DC

## 2013-08-27 NOTE — Telephone Encounter (Signed)
Prescriptions sent to [facility] per patient request

## 2013-09-16 ENCOUNTER — Telehealth: Payer: Self-pay | Admitting: Internal Medicine

## 2013-09-16 NOTE — Telephone Encounter (Signed)
Relevant patient education mailed to patient.  

## 2013-09-22 ENCOUNTER — Telehealth: Payer: Self-pay | Admitting: Emergency Medicine

## 2013-11-24 ENCOUNTER — Telehealth: Payer: Self-pay | Admitting: *Deleted
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
## 2013-11-24 NOTE — Telephone Encounter (Signed)
Patient left message about 2 medication refills from [facility].

## 2014-02-17 ENCOUNTER — Telehealth: Payer: Self-pay | Admitting: Internal Medicine

## 2014-02-17 NOTE — Telephone Encounter (Signed)
Pt left vm asking for call with advice about tick bites.  States on vm she does not want to make an appointment.

## 2014-02-17 NOTE — Telephone Encounter (Signed)
Please advise 

## 2014-02-17 NOTE — Telephone Encounter (Signed)
Left vm requesting pt to return my call 

## 2014-02-17 NOTE — Telephone Encounter (Signed)
We can send her a handout about tick bites. If any fever, headache, then needs to be seen

## 2014-02-18 NOTE — Telephone Encounter (Signed)
Notified pt. 

## 2014-02-19 ENCOUNTER — Other Ambulatory Visit: Payer: Self-pay | Admitting: Internal Medicine

## 2014-03-03 NOTE — Telephone Encounter (Signed)
Okay to refill Lorazepam? Pt states that she does not want refill on Meclizine

## 2014-03-03 NOTE — Telephone Encounter (Signed)
Lorazepam refilled for 30 days

## 2014-03-03 NOTE — Telephone Encounter (Signed)
Okay to refill Lorazepam?  Pt does not want a refill on meclizine

## 2014-03-03 NOTE — Telephone Encounter (Signed)
Okay to refill Lorazepam? Pt states she does not want refil on Meclizine

## 2014-03-19 NOTE — Telephone Encounter (Signed)
Fine to fill. 

## 2014-06-15 ENCOUNTER — Other Ambulatory Visit: Payer: Self-pay | Admitting: *Deleted

## 2014-06-15 MED ORDER — LEVOTHYROXINE SODIUM 25 MCG PO TABS: ORAL_TABLET | ORAL | Status: DC

## 2014-07-02 ENCOUNTER — Other Ambulatory Visit: Payer: Self-pay | Admitting: Internal Medicine

## 2014-07-02 NOTE — Telephone Encounter (Signed)
Last refill 03/03/14 #90, last visit 08/25/13.

## 2014-07-16 ENCOUNTER — Other Ambulatory Visit: Payer: Self-pay | Admitting: Internal Medicine

## 2014-07-30 ENCOUNTER — Encounter: Payer: Self-pay | Admitting: Internal Medicine

## 2014-07-30 ENCOUNTER — Ambulatory Visit: Payer: Medicare HMO | Admitting: Nurse Practitioner

## 2014-07-30 ENCOUNTER — Ambulatory Visit (INDEPENDENT_AMBULATORY_CARE_PROVIDER_SITE_OTHER): Payer: Medicare HMO | Admitting: Internal Medicine

## 2014-07-30 VITALS — BP 110/66 | HR 80 | Temp 97.5°F | Ht 63.5 in | Wt 156.0 lb

## 2014-07-30 DIAGNOSIS — N3000 Acute cystitis without hematuria: Secondary | ICD-10-CM

## 2014-07-30 DIAGNOSIS — N390 Urinary tract infection, site not specified: Secondary | ICD-10-CM | POA: Insufficient documentation

## 2014-07-30 LAB — POCT URINALYSIS DIPSTICK
BILIRUBIN UA: NEGATIVE
Glucose, UA: NEGATIVE
KETONES UA: NEGATIVE
Nitrite, UA: POSITIVE
PH UA: 6.5
Protein, UA: NEGATIVE
SPEC GRAV UA: 1.01
Urobilinogen, UA: 0.2

## 2014-07-30 MED ORDER — CIPROFLOXACIN HCL 250 MG PO TABS: 250.0000 mg | ORAL_TABLET | Freq: Two times a day (BID) | ORAL | Status: DC

## 2014-07-30 NOTE — Progress Notes (Signed)
   Subjective:    Patient ID: Destiny Burns, female    DOB: 1927/05/14, 80 y.o.   MRN: [id number]  HPI  80YO female presents for acute visit.  Developed symptoms of foul smelling urine over last weekend. Mild dysuria. Tried taking Cranberry juice with no improvement. No fever or chills. No flank pain.  Past medical, surgical, family and social history per today's encounter.  Review of Systems  Constitutional: Negative for fever, chills and fatigue.  Gastrointestinal: Negative for nausea, vomiting, abdominal pain, diarrhea, constipation and rectal pain.  Genitourinary: Positive for dysuria. Negative for urgency, frequency, hematuria, flank pain, decreased urine volume, vaginal bleeding, vaginal discharge, difficulty urinating, vaginal pain and pelvic pain.       Objective:    BP 110/66 mmHg  Pulse 80  Temp(Src) 97.5 F (36.4 C) (Oral)  Ht 5' 3.5" (1.613 m)  Wt 156 lb (70.761 kg)  BMI 27.20 kg/m2  SpO2 97% Physical Exam  Constitutional: She is oriented to person, place, and time. She appears well-developed and well-nourished. No distress.  HENT:  Head: Normocephalic and atraumatic.  Right Ear: External ear normal.  Left Ear: External ear normal.  Nose: Nose normal.  Mouth/Throat: Oropharynx is clear and moist.  Eyes: Conjunctivae are normal. Pupils are equal, round, and reactive to light. Right eye exhibits no discharge. Left eye exhibits no discharge. No scleral icterus.  Neck: Normal range of motion. Neck supple. No tracheal deviation present. No thyromegaly present.  Cardiovascular: Normal rate, regular rhythm, normal heart sounds and intact distal pulses.  Exam reveals no gallop and no friction rub.   No murmur heard. Pulmonary/Chest: Effort normal and breath sounds normal. No accessory muscle usage. No tachypnea. No respiratory distress. She has no decreased breath sounds. She has no wheezes. She has no rhonchi. She has no rales. She exhibits no tenderness.  Abdominal:  There is no tenderness (no CVA tenderness).  Musculoskeletal: Normal range of motion. She exhibits no edema or tenderness.  Lymphadenopathy:    She has no cervical adenopathy.  Neurological: She is alert and oriented to person, place, and time. No cranial nerve deficit. She exhibits normal muscle tone. Coordination normal.  Skin: Skin is warm and dry. No rash noted. She is not diaphoretic. No erythema. No pallor.  Psychiatric: She has a normal mood and affect. Her behavior is normal. Judgment and thought content normal.          Assessment & Plan:   Problem List Items Addressed This Visit      Unprioritized   UTI (urinary tract infection) - Primary    UA pos for nitrite, leuk, blood c/w UTI. Will start Cipro bid. Increase fluid intake. Send urine for culture. Follow up if symptoms are not improving.    Relevant Orders      POCT urinalysis dipstick (Completed)      Urine culture       Return if symptoms worsen or fail to improve.

## 2014-07-30 NOTE — Assessment & Plan Note (Signed)
UA pos for nitrite, leuk, blood c/w UTI. Will start Cipro bid. Increase fluid intake. Send urine for culture. Follow up if symptoms are not improving.

## 2014-07-30 NOTE — Patient Instructions (Signed)
Start Cipro 250mg  twice daily for urinary tract infection.  Increase fluid intake as tolerated.  Follow up if symptoms are not improving.

## 2014-08-02 LAB — URINE CULTURE: Colony Count: >=100000

## 2014-08-16 ENCOUNTER — Other Ambulatory Visit: Payer: Self-pay | Admitting: Internal Medicine

## 2014-09-10 ENCOUNTER — Other Ambulatory Visit: Payer: Self-pay | Admitting: Internal Medicine

## 2014-09-29 ENCOUNTER — Ambulatory Visit (INDEPENDENT_AMBULATORY_CARE_PROVIDER_SITE_OTHER): Payer: Medicare HMO | Admitting: Nurse Practitioner

## 2014-09-29 ENCOUNTER — Encounter: Payer: Self-pay | Admitting: Nurse Practitioner

## 2014-09-29 VITALS — BP 128/70 | HR 69 | Temp 98.4°F | Resp 12 | Ht 63.5 in | Wt 155.0 lb

## 2014-09-29 DIAGNOSIS — J00 Acute nasopharyngitis [common cold]: Secondary | ICD-10-CM

## 2014-09-29 DIAGNOSIS — R269 Unspecified abnormalities of gait and mobility: Secondary | ICD-10-CM

## 2014-09-29 DIAGNOSIS — R21 Rash and other nonspecific skin eruption: Secondary | ICD-10-CM

## 2014-09-29 NOTE — Progress Notes (Signed)
Subjective:    Patient ID: Destiny Burns, female    DOB: 15-Nov-1926, 81 y.o.   MRN: [id number]  HPI  Destiny Burns is a 81 yo female with a CC of nasal congestion x 4 days, gait issues, rash on bilateral hands.   1) Started with a sore throat on Saturday. Sunday rhinorrhea, feels better today.  Clear nasal drainage and fatigue are her biggest complaints.   2) She fell this morning, can bottom slipped out from floor, unsure if water on floor, did not hit head or lose consciousness, hurt arm right arm has small hematoma. She did not feel the injury was big enough to go to the ER. She used a grab bar recently installed in her bathroom and pulled herself up. She would like to see if she can get a rolling walker for more stability.   3) Rashes on thenar surfaces, red, she denies pruritis, started using a new "homemade" soap she bought at a market and thinks this is the culprit.   Review of Systems  Constitutional: Positive for fatigue.  HENT: Positive for rhinorrhea and sore throat. Negative for congestion, ear discharge, ear pain, postnasal drip, sinus pressure, sneezing and tinnitus.   Eyes: Negative for visual disturbance.  Respiratory: Negative for cough, chest tightness and wheezing.   Cardiovascular: Negative for chest pain, palpitations and leg swelling.  Gastrointestinal: Negative for nausea, vomiting and diarrhea.  Musculoskeletal: Positive for gait problem. Negative for arthralgias.  Skin: Positive for rash.       Bilateral hands   Past Medical History  Diagnosis Date  . Hypertension   . Thyroid disease   . Dupuytren contracture     History   Social History  . Marital Status: Widowed    Spouse Name: N/A  . Number of Children: N/A  . Years of Education: N/A   Occupational History  . Not on file.   Social History Main Topics  . Smoking status: Never Smoker   . Smokeless tobacco: Never Used  . Alcohol Use: No  . Drug Use: No  . Sexual Activity: Not on file    Other Topics Concern  . Not on file   Social History Narrative    Past Surgical History  Procedure Laterality Date  . Tonsillectomy    . Dilation and curettage of uterus    . Abdominal hysterectomy      Family History  Problem Relation Age of Onset  . Dupuytren's contracture Mother   . Dupuytren's contracture Brother   . Hypertension Brother     Allergies  Allergen Reactions  . Amoxicillin   . Sulfa Antibiotics     Current Outpatient Prescriptions on File Prior to Visit  Medication Sig Dispense Refill  . aspirin 81 MG tablet Take 81 mg by mouth daily.      Marland Kitchen CALCIUM PO Take 1 tablet by mouth 3 (three) times daily.      . cholecalciferol (VITAMIN D) 1000 UNITS tablet Take 1,000 Units by mouth daily.      . ciprofloxacin (CIPRO) 250 MG tablet Take 1 tablet (250 mg total) by mouth 2 (two) times daily. 14 tablet 0  . hydrochlorothiazide (HYDRODIURIL) 25 MG tablet TAKE ONE TABLET BY MOUTH EVERY DAY - NEED TO SCHEDULE APPT PER MD 30 tablet 5  . levothyroxine (SYNTHROID, LEVOTHROID) 25 MCG tablet TAKE ONE (1) TABLET EACH DAY 30 tablet 0  . loratadine (CLARITIN) 10 MG tablet Take 10 mg by mouth daily.      Marland Kitchen  LORazepam (ATIVAN) 0.5 MG tablet TAKE ONE TABLET BY MOUTH THREE TIMES DAILY AS NEEDED 90 tablet 1   No current facility-administered medications on file prior to visit.       Objective:   Physical Exam  Constitutional: She is oriented to person, place, and time. She appears well-developed and well-nourished. No distress.  BP 128/70 mmHg  Pulse 69  Temp(Src) 98.4 F (36.9 C) (Oral)  Resp 12  Ht 5' 3.5" (1.613 m)  Wt 155 lb (70.308 kg)  BMI 27.02 kg/m2  SpO2 95%   HENT:  Head: Normocephalic and atraumatic.  Right Ear: External ear normal.  Left Ear: External ear normal.  Eyes: Conjunctivae and EOM are normal. Pupils are equal, round, and reactive to light. Right eye exhibits no discharge. Left eye exhibits no discharge. No scleral icterus.  Neck: Normal range  of motion. Neck supple. No thyromegaly present.  Cardiovascular: Normal rate, regular rhythm, normal heart sounds and intact distal pulses.  Exam reveals no gallop and no friction rub.   No murmur heard. Pulmonary/Chest: Effort normal and breath sounds normal. No respiratory distress. She has no wheezes. She has no rales. She exhibits no tenderness.  Musculoskeletal: She exhibits no edema or tenderness.  Slow and steady gait, LE is 4/5 against resistance  Lymphadenopathy:    She has no cervical adenopathy.  Neurological: She is alert and oriented to person, place, and time. No cranial nerve deficit. She exhibits normal muscle tone. Coordination normal.  Skin: Skin is warm and dry. No rash noted. She is not diaphoretic.  Psychiatric: She has a normal mood and affect. Her behavior is normal. Judgment and thought content normal.       Assessment & Plan:

## 2014-09-29 NOTE — Patient Instructions (Addendum)
[facility]  [address] Next to the [facility], systems   Continue with Robitussin and follow up if not feeling better in 5 days.

## 2014-09-29 NOTE — Progress Notes (Signed)
Pre visit review using our clinic review tool, if applicable. No additional management support is needed unless otherwise documented below in the visit note. 

## 2014-10-03 DIAGNOSIS — R269 Unspecified abnormalities of gait and mobility: Secondary | ICD-10-CM | POA: Insufficient documentation

## 2014-10-03 DIAGNOSIS — R21 Rash and other nonspecific skin eruption: Secondary | ICD-10-CM | POA: Insufficient documentation

## 2014-10-03 DIAGNOSIS — J00 Acute nasopharyngitis [common cold]: Secondary | ICD-10-CM | POA: Insufficient documentation

## 2014-10-03 NOTE — Assessment & Plan Note (Signed)
Pt feels she is more off balance recently and worsening. She would like to have a rolling walker for safety. Kerin Salen, LPN informed me that there is the [facility] that has walkers for 5 dollars and that she does not need a referral or anything from [place] to obtain. Pt is agreeable to this and will let us know if she did not find anything suitable.

## 2014-10-03 NOTE — Assessment & Plan Note (Signed)
Rash is consistent with contact dermatitis. She will stop using new products and add lotion or OTC steroid cream as needed. FU prn worsening/failure to improve.

## 2014-10-03 NOTE — Assessment & Plan Note (Signed)
Improving. Most likely viral. Pt to continue OTC Claritin and Robitussin. Asked her to call if failure to improve or worsening by Tuesday next week.

## 2014-10-11 ENCOUNTER — Other Ambulatory Visit: Payer: Self-pay | Admitting: Internal Medicine

## 2014-10-12 ENCOUNTER — Telehealth: Payer: Self-pay | Admitting: Internal Medicine

## 2014-10-12 NOTE — Telephone Encounter (Signed)
Patient Name: Destiny Burns DOB: 07-Aug-1927 Initial Comment Caller states she had has a sinus infection. She is sore above her eye. Still coughing. Requesting some meds Nurse Assessment Nurse: Marcelline Deist, RN, Kermit Balo Date/Time Eilene Ghazi Time): 10/12/2014 11:52:48 AM Confirm and document reason for call. If symptomatic, describe symptoms. ---Caller states she had has a sinus infection. She is sore above her eye. Still coughing. Requesting some meds. Seen on 2/17, did not prescribe anything. A few nights ago, woke up sweating, thought she had a fever. Using saline washes & getting mucus out - darker colored. Has the patient traveled out of the country within the last 30 days? ---Not Applicable Does the patient require triage? ---Yes Related visit to physician within the last 2 weeks? ---Yes Does the PT have any chronic conditions? (i.e. diabetes, asthma, etc.) ---No Guidelines Guideline Title Affirmed Question Affirmed Notes Sinus Pain or Congestion [1] Fever returns after gone for over 24 hours AND [2] symptoms worse or not improved Final Disposition User See Physician within [place], RN, Kermit Balo Comments Caller states she wants to be seen today & since nurse could not find availability on the schedule, caller will go to a walk-in clinic where she has been seen before. Feels she needs an antibiotic to get rid of this as she has had it for over 2 weeks.

## 2014-10-12 NOTE — Telephone Encounter (Signed)
FYI

## 2014-10-29 ENCOUNTER — Other Ambulatory Visit: Payer: Self-pay | Admitting: *Deleted

## 2014-10-29 MED ORDER — HYDROCHLOROTHIAZIDE 25 MG PO TABS: ORAL_TABLET | ORAL | Status: DC

## 2014-10-29 MED ORDER — LEVOTHYROXINE SODIUM 25 MCG PO TABS: ORAL_TABLET | ORAL | Status: DC

## 2014-12-14 DIAGNOSIS — L570 Actinic keratosis: Secondary | ICD-10-CM | POA: Insufficient documentation

## 2014-12-14 DIAGNOSIS — Z87898 Personal history of other specified conditions: Secondary | ICD-10-CM | POA: Insufficient documentation

## 2014-12-14 DIAGNOSIS — Z85828 Personal history of other malignant neoplasm of skin: Secondary | ICD-10-CM | POA: Insufficient documentation

## 2014-12-29 ENCOUNTER — Other Ambulatory Visit: Payer: Self-pay | Admitting: Internal Medicine

## 2014-12-29 NOTE — Telephone Encounter (Signed)
Last OV was with Doss for nasal congestion, Last TSH  08/25/13, No future appt scheduled     Okay to refill?

## 2015-03-01 ENCOUNTER — Other Ambulatory Visit: Payer: Self-pay | Admitting: Internal Medicine

## 2015-03-18 ENCOUNTER — Ambulatory Visit (INDEPENDENT_AMBULATORY_CARE_PROVIDER_SITE_OTHER): Payer: Medicare HMO | Admitting: Nurse Practitioner

## 2015-03-18 DIAGNOSIS — R399 Unspecified symptoms and signs involving the genitourinary system: Secondary | ICD-10-CM

## 2015-03-18 DIAGNOSIS — R300 Dysuria: Secondary | ICD-10-CM

## 2015-03-18 LAB — POCT URINALYSIS DIPSTICK
Bilirubin, UA: NEGATIVE
Glucose, UA: NEGATIVE
Ketones, UA: NEGATIVE
Nitrite, UA: POSITIVE
Spec Grav, UA: 1.025
UROBILINOGEN UA: 0.2
pH, UA: 7

## 2015-03-18 MED ORDER — CEPHALEXIN 500 MG PO CAPS: 500.0000 mg | ORAL_CAPSULE | Freq: Three times a day (TID) | ORAL | Status: DC

## 2015-03-18 NOTE — Progress Notes (Signed)
Pre visit review using our clinic review tool, if applicable. No additional management support is needed unless otherwise documented below in the visit note. 

## 2015-03-18 NOTE — Patient Instructions (Signed)
Please take the antibiotics as prescribed.   Call us if no relief by Monday

## 2015-03-18 NOTE — Progress Notes (Signed)
Patient ID: Destiny Burns, female    DOB: Sep 26, 1926  Age: 81 y.o. MRN: [id number]  CC: Cystitis   HPI [name] presents for UTI symptoms.   1) Had recent dental work and was on abx, which helped with symptoms. She reports symptoms started back a few days ago and has urgency and low abdominal tenderness.   History Destiny Burns has a past medical history of Hypertension; Thyroid disease; and Dupuytren contracture.   She has past surgical history that includes Tonsillectomy; Dilation and curettage of uterus; and Abdominal hysterectomy.   Her family history includes Dupuytren's contracture in her brother and mother; Hypertension in her brother.She reports that she has never smoked. She has never used smokeless tobacco. She reports that she does not drink alcohol or use illicit drugs.  Outpatient Prescriptions Prior to Visit  Medication Sig Dispense Refill  . aspirin 81 MG tablet Take 81 mg by mouth daily.      Marland Kitchen CALCIUM PO Take 1 tablet by mouth 3 (three) times daily.      . cholecalciferol (VITAMIN D) 1000 UNITS tablet Take 1,000 Units by mouth daily.      . ciprofloxacin (CIPRO) 250 MG tablet Take 1 tablet (250 mg total) by mouth 2 (two) times daily. 14 tablet 0  . hydrochlorothiazide (HYDRODIURIL) 25 MG tablet TAKE 1 TABLET EVERY DAY 90 tablet 3  . levothyroxine (SYNTHROID, LEVOTHROID) 25 MCG tablet TAKE 1 TABLET EVERY DAY 90 tablet 1  . loratadine (CLARITIN) 10 MG tablet Take 10 mg by mouth daily.      Marland Kitchen LORazepam (ATIVAN) 0.5 MG tablet TAKE ONE TABLET BY MOUTH THREE TIMES DAILY AS NEEDED 90 tablet 1   No facility-administered medications prior to visit.    ROS Review of Systems  Constitutional: Negative for fever, chills, diaphoresis and fatigue.  Gastrointestinal: Positive for abdominal pain. Negative for nausea, vomiting and diarrhea.  Genitourinary: Positive for urgency. Negative for dysuria, frequency, hematuria, flank pain, enuresis and difficulty urinating.    Objective:  BP 102/64 mmHg  Pulse 94  Temp(Src) 97.7 F (36.5 C)  Resp 14  Ht 5' 3.5" (1.613 m)  Wt 153 lb 12.8 oz (69.763 kg)  BMI 26.81 kg/m2  SpO2 96%  Physical Exam  Constitutional: She is oriented to person, place, and time. She appears well-developed and well-nourished. No distress.  HENT:  Head: Normocephalic and atraumatic.  Abdominal: Soft. She exhibits no distension and no mass. There is no tenderness. There is no rebound and no guarding.  Neg CVA tenderness  Neurological: She is alert and oriented to person, place, and time.  Skin: Skin is warm and dry. No rash noted. She is not diaphoretic.  Psychiatric: She has a normal mood and affect. Her behavior is normal. Judgment and thought content normal.    Assessment & Plan:   Omega was seen today for cystitis.  Diagnoses and all orders for this visit:  UTI symptoms -     POCT Urinalysis Dipstick -     Urine Culture  Other orders -     cephALEXin (KEFLEX) 500 MG capsule; Take 1 capsule (500 mg total) by mouth 3 (three) times daily.   I am having Ms. Truex start on cephALEXin. I am also having her maintain her aspirin, loratadine, cholecalciferol, CALCIUM PO, LORazepam, ciprofloxacin, hydrochlorothiazide, and levothyroxine.  Meds ordered this encounter  Medications  . cephALEXin (KEFLEX) 500 MG capsule    Sig: Take 1 capsule (500 mg total) by mouth 3 (three) times daily.  Dispense:  21 capsule    Refill:  0    Order Specific Question:  Supervising Provider    Answer:  Crecencio Mc [[id number]]     Follow-up: Return if symptoms worsen or fail to improve.

## 2015-03-21 LAB — URINE CULTURE

## 2015-03-24 ENCOUNTER — Encounter: Payer: Self-pay | Admitting: Nurse Practitioner

## 2015-03-24 DIAGNOSIS — R399 Unspecified symptoms and signs involving the genitourinary system: Secondary | ICD-10-CM | POA: Insufficient documentation

## 2015-03-24 DIAGNOSIS — R300 Dysuria: Secondary | ICD-10-CM | POA: Insufficient documentation

## 2015-03-24 NOTE — Assessment & Plan Note (Signed)
POCT urine suspicious for UTI, Will obtain culture, start on Keflex due to last culture results. Encouraged probiotics and water. FU prn worsening/failure to improve.

## 2015-04-04 ENCOUNTER — Ambulatory Visit: Payer: Medicare HMO | Admitting: Podiatry

## 2015-04-06 ENCOUNTER — Ambulatory Visit (INDEPENDENT_AMBULATORY_CARE_PROVIDER_SITE_OTHER): Payer: Medicare HMO | Admitting: Podiatry

## 2015-04-06 ENCOUNTER — Ambulatory Visit (INDEPENDENT_AMBULATORY_CARE_PROVIDER_SITE_OTHER): Payer: Medicare HMO

## 2015-04-06 ENCOUNTER — Encounter: Payer: Self-pay | Admitting: Podiatry

## 2015-04-06 VITALS — BP 127/68 | HR 76 | Resp 18

## 2015-04-06 DIAGNOSIS — M79676 Pain in unspecified toe(s): Secondary | ICD-10-CM

## 2015-04-06 DIAGNOSIS — B351 Tinea unguium: Secondary | ICD-10-CM

## 2015-04-06 DIAGNOSIS — R52 Pain, unspecified: Secondary | ICD-10-CM

## 2015-04-06 NOTE — Progress Notes (Signed)
   Subjective:    Patient ID: Destiny Burns, female    DOB: Jan 12, 1927, 81 y.o.   MRN: [id number]  HPI I HAVE TWO TOENAILS ON BOTH OF MY FEET THAT ARE THICK AND DISCOLORED AND I HAVE A CORN ON MY RIGHT BIG TOE AND I USE A PAD ON MY 2ND TOE AND MY FEET SWELL AND MY FEET AND HANDS STAY COLD AND BURNS AND GETS SORE.  She states that towards the rub between the toes. She has trouble trimming her nails.    Review of Systems  All other systems reviewed and are negative.      Objective:   Physical Exam: 81 year old female in no acute distress presents vital signs stable alert oriented 3 ambulating with a cane left hand. Pulses are strongly palpable bilateral. Neurologic sensorium is intact per Semmes-Weinstein monofilament. Deep tendon reflexes intact bilateral and muscle strength +5 over 5 dorsiflexion plantar flexors and inverters everters all intrinsic musculature is intact. Orthopedic evaluation of a straight solid joints distal to the ankle range of motion without crepitation. Cutaneous evaluation demonstrates reactive hyperkeratosis to the medial aspect of the second digit right in the lateral aspect of the hallux right. Other multiple porokeratosis plantar aspect bilateral foot. Her nails are thick yellow dystrophic clinic mycotic and painful palpation.        Assessment & Plan:  Assessment: Pain and limb secondary to onychomycosis bilateral. Hammertoe deformities with porokeratosis bilateral.  Plan: Debridement toenails 1 through 5 in 6-10 bilateral. He

## 2015-04-11 ENCOUNTER — Other Ambulatory Visit: Payer: Self-pay | Admitting: Internal Medicine

## 2015-04-15 ENCOUNTER — Telehealth: Payer: Self-pay | Admitting: *Deleted

## 2015-04-15 NOTE — Telephone Encounter (Signed)
Patient has requested to get a refill for beniva. -Thanks

## 2015-04-15 NOTE — Telephone Encounter (Signed)
Rx sent on 8.29.16

## 2015-04-28 ENCOUNTER — Encounter: Payer: Self-pay | Admitting: Family Medicine

## 2015-04-28 ENCOUNTER — Ambulatory Visit (INDEPENDENT_AMBULATORY_CARE_PROVIDER_SITE_OTHER): Payer: Medicare HMO | Admitting: Family Medicine

## 2015-04-28 VITALS — BP 120/80 | HR 84 | Temp 98.0°F | Ht 63.5 in | Wt 129.5 lb

## 2015-04-28 DIAGNOSIS — M545 Low back pain, unspecified: Secondary | ICD-10-CM | POA: Insufficient documentation

## 2015-04-28 DIAGNOSIS — I499 Cardiac arrhythmia, unspecified: Secondary | ICD-10-CM | POA: Diagnosis not present

## 2015-04-28 MED ORDER — TRAMADOL HCL 50 MG PO TABS: 50.0000 mg | ORAL_TABLET | Freq: Three times a day (TID) | ORAL | Status: DC | PRN

## 2015-04-28 NOTE — Assessment & Plan Note (Addendum)
Noted on exam today. EKG was obtained and revealed sinus rhythm with PAC. Will continue to monitor. No indication for intervention.

## 2015-04-28 NOTE — Patient Instructions (Signed)
Use the Tramadol as directed.  You can take tylenol 1000 mg three times daily for pain.  Be sure to get your xray.  Follow up with Dr. Gilford Rile in the next 2 weeks.  Take care  Dr. Lacinda Axon

## 2015-04-28 NOTE — Progress Notes (Signed)
Pre visit review using our clinic review tool, if applicable. No additional management support is needed unless otherwise documented below in the visit note. 

## 2015-04-28 NOTE — Progress Notes (Signed)
   Subjective:  Patient ID: Destiny Burns, female    DOB: 1927/01/29  Age: 81 y.o. MRN: [id number]  CC: Back pain   HPI:  81 year old female presents to the clinic today with complaints of back pain.  Patient reports a long-standing history of low back pain, per her report. She reports over the past several months this is been gradually worsening. She's been using topical treatments as well as Tylenol with some improvement. She reports that the pain is worse with physical activity. She denies any saddle anesthesia. She does have some urinary incontinence but this is a long-standing issue as well. No reports of radiation down the extremities. No reported fevers or chills. No other associated symptoms.  Social Hx  - Nonsmoker.  Review of Systems  Constitutional: Negative for fever and chills.  Musculoskeletal: Positive for back pain.   Objective:  BP 120/80 mmHg  Pulse 84  Temp(Src) 98 F (36.7 C) (Oral)  Ht 5' 3.5" (1.613 m)  Wt 129 lb 8 oz (58.741 kg)  BMI 22.58 kg/m2  SpO2 95%  BP/Weight 04/28/2015 5/37/4827 0/02/8674  Systolic BP 449 201 007  Diastolic BP 80 68 64  Wt. (Lbs) 129.5 - 153.8  BMI 22.58 - 26.81   Physical Exam  Constitutional:  Frail elderly female in NAD.   Cardiovascular: Normal rate.   No murmur heard. Irregular.   Pulmonary/Chest: Effort normal and breath sounds normal. No respiratory distress. She has no wheezes. She has no rales.  Musculoskeletal:  Thoracic kyphosis noted.  Low back -  inspection normal. Nontender to palpation. Patient does have decreased range of motion in all planes.  Neurological: She is alert.  Psychiatric: She has a normal mood and affect.  Vitals reviewed.  EKG - Normal Sinus rhythm at the rate of 73. PAC present. Borderline prolonged PR interval.   Assessment & Plan:   Problem List Items Addressed This Visit    Irregular heart rate    Noted on exam today. EKG was obtained and revealed sinus rhythm with PAC. Will  continue to monitor. No indication for intervention.      Relevant Orders   EKG 12-Lead (Completed)   Low back pain - Primary    Obtaining xray as patient is at high risk of fracture. Treating pain with tramadol. Rx given today. Also advised Tylenol 1000 mg 3 times a day as needed. Patient to follow up closely with PCP.      Relevant Medications   traMADol (ULTRAM) 50 MG tablet   Other Relevant Orders   DG Lumbar Spine Complete     Meds ordered this encounter  Medications  . traMADol (ULTRAM) 50 MG tablet    Sig: Take 1 tablet (50 mg total) by mouth every 8 (eight) hours as needed.    Dispense:  30 tablet    Refill:  0    Follow-up: In the next few weeks for a recheck.  Thersa Salt, DO

## 2015-04-28 NOTE — Assessment & Plan Note (Signed)
Obtaining xray as patient is at high risk of fracture. Treating pain with tramadol. Rx given today. Also advised Tylenol 1000 mg 3 times a day as needed. Patient to follow up closely with PCP.

## 2015-05-02 ENCOUNTER — Ambulatory Visit
Admission: RE | Admit: 2015-05-02 | Discharge: 2015-05-02 | Disposition: A | Discharge status: Home / Self Care | Payer: Medicare HMO | Source: Ambulatory Visit | Attending: Family Medicine | Admitting: Family Medicine

## 2015-05-02 ENCOUNTER — Telehealth: Payer: Self-pay

## 2015-05-02 DIAGNOSIS — M4856XA Collapsed vertebra, not elsewhere classified, lumbar region, initial encounter for fracture: Secondary | ICD-10-CM | POA: Insufficient documentation

## 2015-05-02 DIAGNOSIS — I2510 Atherosclerotic heart disease of native coronary artery without angina pectoris: Secondary | ICD-10-CM | POA: Diagnosis not present

## 2015-05-02 DIAGNOSIS — M545 Low back pain: Secondary | ICD-10-CM

## 2015-05-02 DIAGNOSIS — M8580 Other specified disorders of bone density and structure, unspecified site: Secondary | ICD-10-CM | POA: Diagnosis not present

## 2015-05-02 NOTE — Telephone Encounter (Signed)
Error

## 2015-05-10 ENCOUNTER — Encounter: Payer: Self-pay | Admitting: Internal Medicine

## 2015-05-10 ENCOUNTER — Ambulatory Visit (INDEPENDENT_AMBULATORY_CARE_PROVIDER_SITE_OTHER): Payer: Medicare HMO | Admitting: Internal Medicine

## 2015-05-10 VITALS — BP 87/52 | HR 80 | Temp 98.0°F | Ht 63.5 in | Wt 148.2 lb

## 2015-05-10 DIAGNOSIS — S32030A Wedge compression fracture of third lumbar vertebra, initial encounter for closed fracture: Secondary | ICD-10-CM | POA: Insufficient documentation

## 2015-05-10 DIAGNOSIS — M545 Low back pain: Secondary | ICD-10-CM | POA: Diagnosis not present

## 2015-05-10 DIAGNOSIS — K5900 Constipation, unspecified: Secondary | ICD-10-CM

## 2015-05-10 DIAGNOSIS — S32030D Wedge compression fracture of third lumbar vertebra, subsequent encounter for fracture with routine healing: Secondary | ICD-10-CM

## 2015-05-10 MED ORDER — LACTULOSE 10 GM/15ML PO SOLN: 20.0000 g | Freq: Three times a day (TID) | ORAL | Status: DC

## 2015-05-10 NOTE — Assessment & Plan Note (Signed)
Low back pain. Xray showed L3 compression fracture. Reviewed with family today. Will set up ortho evaluation. Question if she is a candidate for kyphoplasty. Continue prn Tramadol for pain.

## 2015-05-10 NOTE — Progress Notes (Signed)
Pre visit review using our clinic review tool, if applicable. No additional management support is needed unless otherwise documented below in the visit note. 

## 2015-05-10 NOTE — Progress Notes (Signed)
Subjective:    Patient ID: Destiny Burns, female    DOB: 01-24-1927, 81 y.o.   MRN: [id number]  HPI  81YO female presents for acute visit.  Seen by Dr. Lacinda Axon on 9/15 for low back pain. Found to have L3 compression fracture. Continues to have aching pain in lower back. Made worse with ambulation. Improved with rest. Improved with Tramadol, however has avoided use because of worry about sedation and dependence.   Constipated over the last 2 weeks. Had small pebble stool this morning. Drinking fluids. Taking prunes with no improvement. Abdomen feels full however not painful. No NV.   Wt Readings from Last 3 Encounters:  05/10/15 148 lb 4 oz (67.246 kg)  04/28/15 129 lb 8 oz (58.741 kg)  03/18/15 153 lb 12.8 oz (69.763 kg)   BP Readings from Last 3 Encounters:  05/10/15 87/52  04/28/15 120/80  04/06/15 127/68    Past Medical History  Diagnosis Date  . Hypertension   . Thyroid disease   . Dupuytren contracture    Family History  Problem Relation Age of Onset  . Dupuytren's contracture Mother   . Dupuytren's contracture Brother   . Hypertension Brother    Past Surgical History  Procedure Laterality Date  . Tonsillectomy    . Dilation and curettage of uterus    . Abdominal hysterectomy     Social History   Social History  . Marital Status: Widowed    Spouse Name: N/A  . Number of Children: N/A  . Years of Education: N/A   Social History Main Topics  . Smoking status: Never Smoker   . Smokeless tobacco: Never Used  . Alcohol Use: No  . Drug Use: No  . Sexual Activity: Not Asked   Other Topics Concern  . None   Social History Narrative    Review of Systems  Constitutional: Negative for fever, chills, appetite change, fatigue and unexpected weight change.  Eyes: Negative for visual disturbance.  Respiratory: Negative for shortness of breath.   Cardiovascular: Negative for chest pain and leg swelling.  Gastrointestinal: Positive for constipation and  abdominal distention. Negative for nausea, vomiting, abdominal pain, diarrhea, blood in stool, anal bleeding and rectal pain.  Musculoskeletal: Positive for myalgias, back pain, arthralgias and gait problem.  Skin: Negative for color change and rash.  Hematological: Negative for adenopathy. Does not bruise/bleed easily.  Psychiatric/Behavioral: Negative for dysphoric mood. The patient is not nervous/anxious.        Objective:    BP 87/52 mmHg  Pulse 80  Temp(Src) 98 F (36.7 C) (Oral)  Ht 5' 3.5" (1.613 m)  Wt 148 lb 4 oz (67.246 kg)  BMI 25.85 kg/m2  SpO2 97% Physical Exam  Constitutional: She is oriented to person, place, and time. She appears well-developed and well-nourished. No distress.  HENT:  Head: Normocephalic and atraumatic.  Right Ear: External ear normal.  Left Ear: External ear normal.  Nose: Nose normal.  Mouth/Throat: Oropharynx is clear and moist. No oropharyngeal exudate.  Eyes: Conjunctivae are normal. Pupils are equal, round, and reactive to light. Right eye exhibits no discharge. Left eye exhibits no discharge. No scleral icterus.  Neck: Normal range of motion. Neck supple. No tracheal deviation present. No thyromegaly present.  Cardiovascular: Normal rate, regular rhythm, normal heart sounds and intact distal pulses.  Exam reveals no gallop and no friction rub.   No murmur heard. Pulmonary/Chest: Effort normal and breath sounds normal. No respiratory distress. She has no wheezes. She has no rales.  She exhibits no tenderness.  Abdominal: Soft. Bowel sounds are normal. She exhibits distension (mild). She exhibits no mass. There is no tenderness. There is no rebound and no guarding.  Musculoskeletal: She exhibits no edema.       Lumbar back: She exhibits decreased range of motion, tenderness and pain. She exhibits no edema and no deformity.  Lymphadenopathy:    She has no cervical adenopathy.  Neurological: She is alert and oriented to person, place, and time.  No cranial nerve deficit. She exhibits normal muscle tone. Coordination normal.  Skin: Skin is warm and dry. No rash noted. She is not diaphoretic. No erythema. No pallor.  Psychiatric: She has a normal mood and affect. Her behavior is normal. Judgment and thought content normal.          Assessment & Plan:   Problem List Items Addressed This Visit      Unprioritized   CN (constipation)    Constipation worsened by use of Tramadol. Will treat with Lactulose today, max 3 doses. Then, start Miralax daily. She will call if no improvement with this.      Compression fracture of L3 lumbar vertebra    Reviewed imaging with family and pt today. Will set up ortho evaluation to see if any intervention such as kyphoplasty might be helpful. Will continue prn Tramadol for pain.      Relevant Orders   Ambulatory referral to Orthopedic Surgery   Low back pain - Primary    Low back pain. Xray showed L3 compression fracture. Reviewed with family today. Will set up ortho evaluation. Question if she is a candidate for kyphoplasty. Continue prn Tramadol for pain.          Return in about 2 weeks (around 05/24/2015) for Recheck.

## 2015-05-10 NOTE — Assessment & Plan Note (Signed)
Constipation worsened by use of Tramadol. Will treat with Lactulose today, max 3 doses. Then, start Miralax daily. She will call if no improvement with this.

## 2015-05-10 NOTE — Assessment & Plan Note (Addendum)
Reviewed imaging with family and pt today. Will set up ortho evaluation to see if any intervention such as kyphoplasty might be helpful. Will continue prn Tramadol for pain.

## 2015-05-10 NOTE — Patient Instructions (Signed)
We will set up an evaluation with orthopedics for the compression fracture.  Start Lactulose 2 tablespoons three times today. Stop once you have had a bowel movement.  Start Miralax 17gm daily mixed with a beverage.  Follow up in 2 weeks.

## 2015-05-16 ENCOUNTER — Ambulatory Visit: Payer: Medicare HMO | Admitting: Internal Medicine

## 2015-05-17 ENCOUNTER — Telehealth: Payer: Self-pay

## 2015-05-17 NOTE — Telephone Encounter (Signed)
No. This is likely the effect of the medication we used to help with constipation. She should stop the laxatives. Increase fluid intake. Follow up in next 1-2 weeks. 10min

## 2015-05-17 NOTE — Telephone Encounter (Signed)
Patient called the triage line, she was very constipated last week and we had given her numerous items to help her have a BM.  Patient stated she had a BM yesterday and now it has changed to diarrhea.  Please advise if something can be called in for her.  Thanks

## 2015-05-17 NOTE — Telephone Encounter (Signed)
She has a follow up with you on the 11th scheduled already.

## 2015-05-18 ENCOUNTER — Telehealth: Payer: Self-pay | Admitting: Internal Medicine

## 2015-05-18 NOTE — Telephone Encounter (Signed)
Pt sister-Frances Lovena Le called to state that Dr. Marry Guan doesn't do back surg and that Dr. Rudene Christians does. Please advise msn

## 2015-05-18 NOTE — Telephone Encounter (Signed)
Fine to set her up with Dr. Rudene Christians

## 2015-05-23 ENCOUNTER — Other Ambulatory Visit: Payer: Self-pay | Admitting: Orthopedic Surgery

## 2015-05-23 DIAGNOSIS — S32030A Wedge compression fracture of third lumbar vertebra, initial encounter for closed fracture: Secondary | ICD-10-CM

## 2015-05-24 ENCOUNTER — Ambulatory Visit
Admission: RE | Admit: 2015-05-24 | Discharge: 2015-05-24 | Disposition: A | Discharge status: Home / Self Care | Payer: Medicare HMO | Source: Ambulatory Visit | Attending: Orthopedic Surgery | Admitting: Orthopedic Surgery

## 2015-05-24 ENCOUNTER — Ambulatory Visit: Payer: Medicare HMO | Admitting: Internal Medicine

## 2015-05-24 DIAGNOSIS — S32030A Wedge compression fracture of third lumbar vertebra, initial encounter for closed fracture: Secondary | ICD-10-CM | POA: Diagnosis present

## 2015-05-24 DIAGNOSIS — X58XXXA Exposure to other specified factors, initial encounter: Secondary | ICD-10-CM | POA: Insufficient documentation

## 2015-05-24 DIAGNOSIS — M4856XA Collapsed vertebra, not elsewhere classified, lumbar region, initial encounter for fracture: Secondary | ICD-10-CM | POA: Diagnosis not present

## 2015-05-26 ENCOUNTER — Telehealth: Payer: Self-pay

## 2015-05-26 NOTE — Telephone Encounter (Signed)
No. Levothyroxine will not cause hair to fall out. Stopping the medication would be dangerous. She should set up a follow up visit.

## 2015-05-26 NOTE — Telephone Encounter (Signed)
Spoke with the patient, she is having surgery next week on Thursday she will schedule a follow up appointment after that!

## 2015-05-26 NOTE — Telephone Encounter (Signed)
Patient called the triage line, complaints of her levothyroxine causing her hair to fall out.  Would like to stop the medication, what can she take instead? Please advise?

## 2015-05-30 ENCOUNTER — Other Ambulatory Visit: Payer: Medicare HMO

## 2015-05-30 ENCOUNTER — Encounter: Payer: Self-pay | Admitting: *Deleted

## 2015-05-30 NOTE — Patient Instructions (Signed)
  Your procedure is scheduled on: 05-31-15 Report to [place] To find out your arrival time please call [phone] between 1PM - 3PM on 05-30-15  Remember: Instructions that are not followed completely may result in serious medical risk, up to and including death, or upon the discretion of your surgeon and anesthesiologist your surgery may need to be rescheduled.    __X__ 1. Do not eat food or drink liquids after midnight. No gum chewing or hard candies.     __X__ 2. No Alcohol for 24 hours before or after surgery.   ____ 3. Bring all medications with you on the day of surgery if instructed.    ____ 4. Notify your doctor if there is any change in your medical condition     (cold, fever, infections).     Do not wear jewelry, make-up, hairpins, clips or nail polish.  Do not wear lotions, powders, or perfumes. You may wear deodorant.  Do not shave 48 hours prior to surgery. Men may shave face and neck.  Do not bring valuables to the hospital.    [facility] is not responsible for any belongings or valuables.               Contacts, dentures or bridgework may not be worn into surgery.  Leave your suitcase in the car. After surgery it may be brought to your room.  For patients admitted to the hospital, discharge time is determined by your  treatment team.   Patients discharged the day of surgery will not be allowed to drive home.   Please read over the following fact sheets that you were given:      _X___ Take these medicines the morning of surgery with A SIP OF WATER:    1. LISINOPRIL  2.   3.   4.  5.  6.  ____ Fleet Enema (as directed)   ____ Use CHG Soap as directed  ____ Use inhalers on the day of surgery  ____ Stop metformin 2 days prior to surgery    ____ Take 1/2 of usual insulin dose the night before surgery and none on the morning of surgery.   ____ Stop Coumadin/Plavix/aspirin-STOP ASA NOW  ____ Stop  Anti-inflammatories   ____ Stop supplements until after surgery.    ____ Bring C-Pap to the hospital.

## 2015-05-31 ENCOUNTER — Ambulatory Visit: Payer: Medicare HMO

## 2015-05-31 ENCOUNTER — Encounter: Payer: Self-pay | Admitting: *Deleted

## 2015-05-31 ENCOUNTER — Ambulatory Visit: Payer: Medicare HMO | Admitting: Anesthesiology

## 2015-05-31 ENCOUNTER — Ambulatory Visit
Admission: RE | Admit: 2015-05-31 | Discharge: 2015-05-31 | Disposition: A | Discharge status: Home / Self Care | Payer: Medicare HMO | Source: Ambulatory Visit | Attending: Orthopedic Surgery | Admitting: Orthopedic Surgery

## 2015-05-31 ENCOUNTER — Encounter
Admission: RE | Disposition: A | Discharge status: Home / Self Care | Payer: Self-pay | Source: Ambulatory Visit | Attending: Orthopedic Surgery

## 2015-05-31 DIAGNOSIS — Z881 Allergy status to other antibiotic agents status: Secondary | ICD-10-CM | POA: Diagnosis not present

## 2015-05-31 DIAGNOSIS — Z8249 Family history of ischemic heart disease and other diseases of the circulatory system: Secondary | ICD-10-CM | POA: Diagnosis not present

## 2015-05-31 DIAGNOSIS — Z882 Allergy status to sulfonamides status: Secondary | ICD-10-CM | POA: Diagnosis not present

## 2015-05-31 DIAGNOSIS — Y929 Unspecified place or not applicable: Secondary | ICD-10-CM | POA: Diagnosis not present

## 2015-05-31 DIAGNOSIS — K219 Gastro-esophageal reflux disease without esophagitis: Secondary | ICD-10-CM | POA: Diagnosis not present

## 2015-05-31 DIAGNOSIS — I491 Atrial premature depolarization: Secondary | ICD-10-CM | POA: Diagnosis not present

## 2015-05-31 DIAGNOSIS — I493 Ventricular premature depolarization: Secondary | ICD-10-CM | POA: Insufficient documentation

## 2015-05-31 DIAGNOSIS — X500XXA Overexertion from strenuous movement or load, initial encounter: Secondary | ICD-10-CM | POA: Diagnosis not present

## 2015-05-31 DIAGNOSIS — Z90710 Acquired absence of both cervix and uterus: Secondary | ICD-10-CM | POA: Diagnosis not present

## 2015-05-31 DIAGNOSIS — I440 Atrioventricular block, first degree: Secondary | ICD-10-CM | POA: Diagnosis not present

## 2015-05-31 DIAGNOSIS — S32039A Unspecified fracture of third lumbar vertebra, initial encounter for closed fracture: Secondary | ICD-10-CM | POA: Insufficient documentation

## 2015-05-31 DIAGNOSIS — Y9389 Activity, other specified: Secondary | ICD-10-CM | POA: Diagnosis not present

## 2015-05-31 DIAGNOSIS — Z419 Encounter for procedure for purposes other than remedying health state, unspecified: Secondary | ICD-10-CM

## 2015-05-31 DIAGNOSIS — E079 Disorder of thyroid, unspecified: Secondary | ICD-10-CM | POA: Diagnosis not present

## 2015-05-31 DIAGNOSIS — Z79899 Other long term (current) drug therapy: Secondary | ICD-10-CM | POA: Insufficient documentation

## 2015-05-31 DIAGNOSIS — I10 Essential (primary) hypertension: Secondary | ICD-10-CM | POA: Insufficient documentation

## 2015-05-31 DIAGNOSIS — M720 Palmar fascial fibromatosis [Dupuytren]: Secondary | ICD-10-CM | POA: Diagnosis not present

## 2015-05-31 DIAGNOSIS — D649 Anemia, unspecified: Secondary | ICD-10-CM | POA: Insufficient documentation

## 2015-05-31 DIAGNOSIS — M4856XA Collapsed vertebra, not elsewhere classified, lumbar region, initial encounter for fracture: Secondary | ICD-10-CM | POA: Diagnosis present

## 2015-05-31 HISTORY — DX: Hypothyroidism, unspecified: E03.9

## 2015-05-31 HISTORY — DX: Gastro-esophageal reflux disease without esophagitis: K21.9

## 2015-05-31 HISTORY — PX: KYPHOPLASTY: SHX5884

## 2015-05-31 HISTORY — DX: Anemia, unspecified: D64.9

## 2015-05-31 HISTORY — DX: Unspecified osteoarthritis, unspecified site: M19.90

## 2015-05-31 HISTORY — DX: Malignant (primary) neoplasm, unspecified: C80.1

## 2015-05-31 LAB — BASIC METABOLIC PANEL
Anion gap: 7 (ref 5–15)
BUN: 20 mg/dL (ref 6–20)
CALCIUM: 10.2 mg/dL (ref 8.9–10.3)
CHLORIDE: 101 mmol/L (ref 101–111)
CO2: 30 mmol/L (ref 22–32)
CREATININE: 0.68 mg/dL (ref 0.44–1.00)
GFR calc Af Amer: >60 mL/min (ref >60)
GFR calc non Af Amer: >60 mL/min (ref >60)
GLUCOSE: 103 mg/dL — AB (ref 65–99)
Potassium: 3.8 mmol/L (ref 3.5–5.1)
Sodium: 138 mmol/L (ref 135–145)

## 2015-05-31 LAB — SURGICAL PCR SCREEN
MRSA, PCR: NEGATIVE
Staphylococcus aureus: NEGATIVE

## 2015-05-31 SURGERY — KYPHOPLASTY
Anesthesia: General | Site: Spine Lumbar | Wound class: Clean

## 2015-05-31 MED ORDER — BUPIVACAINE-EPINEPHRINE (PF) 0.5% -1:200000 IJ SOLN
INTRAMUSCULAR | Status: AC
  Filled 2015-05-31: qty 30

## 2015-05-31 MED ORDER — ONDANSETRON HCL 4 MG/2ML IJ SOLN
INTRAMUSCULAR | Status: DC | PRN
  Administered 2015-05-31: 4 mg via INTRAVENOUS

## 2015-05-31 MED ORDER — CLINDAMYCIN PHOSPHATE 900 MG/50ML IV SOLN
INTRAVENOUS | Status: AC
  Administered 2015-05-31: 900 mg via INTRAVENOUS
  Filled 2015-05-31: qty 50

## 2015-05-31 MED ORDER — MIDAZOLAM HCL 5 MG/5ML IJ SOLN
INTRAMUSCULAR | Status: DC | PRN
  Administered 2015-05-31 (×2): 1 mg via INTRAVENOUS

## 2015-05-31 MED ORDER — FENTANYL CITRATE (PF) 100 MCG/2ML IJ SOLN
INTRAMUSCULAR | Status: DC | PRN
  Administered 2015-05-31: 50 ug via INTRAVENOUS

## 2015-05-31 MED ORDER — CLINDAMYCIN PHOSPHATE 900 MG/50ML IV SOLN: 900.0000 mg | Freq: Once | INTRAVENOUS | Status: DC

## 2015-05-31 MED ORDER — ONDANSETRON HCL 4 MG/2ML IJ SOLN: 4.0000 mg | Freq: Once | INTRAMUSCULAR | Status: DC | PRN

## 2015-05-31 MED ORDER — PROPOFOL 500 MG/50ML IV EMUL
INTRAVENOUS | Status: DC | PRN
  Administered 2015-05-31: 50 ug/kg/min via INTRAVENOUS

## 2015-05-31 MED ORDER — BUPIVACAINE-EPINEPHRINE (PF) 0.5% -1:200000 IJ SOLN
INTRAMUSCULAR | Status: DC | PRN
  Administered 2015-05-31: 30 mL via PERINEURAL

## 2015-05-31 MED ORDER — HYDROCODONE-ACETAMINOPHEN 5-325 MG PO TABS: 1.0000 | ORAL_TABLET | Freq: Four times a day (QID) | ORAL | Status: DC | PRN

## 2015-05-31 MED ORDER — LIDOCAINE HCL (PF) 1 % IJ SOLN
INTRAMUSCULAR | Status: AC
  Filled 2015-05-31: qty 60

## 2015-05-31 MED ORDER — IOHEXOL 180 MG/ML  SOLN
INTRAMUSCULAR | Status: DC | PRN
  Administered 2015-05-31: 40 mL

## 2015-05-31 MED ORDER — FAMOTIDINE 20 MG PO TABS
20.0000 mg | ORAL_TABLET | Freq: Once | ORAL | Status: AC
  Administered 2015-05-31: 20 mg via ORAL

## 2015-05-31 MED ORDER — LIDOCAINE HCL (CARDIAC) 20 MG/ML IV SOLN
INTRAVENOUS | Status: DC | PRN
  Administered 2015-05-31: 30 mg via INTRAVENOUS

## 2015-05-31 MED ORDER — FAMOTIDINE 20 MG PO TABS
ORAL_TABLET | ORAL | Status: AC
  Administered 2015-05-31: 20 mg via ORAL
  Filled 2015-05-31: qty 1

## 2015-05-31 MED ORDER — LIDOCAINE HCL 1 % IJ SOLN
INTRAMUSCULAR | Status: DC | PRN
  Administered 2015-05-31: 30 mL

## 2015-05-31 MED ORDER — IOHEXOL 180 MG/ML  SOLN
INTRAMUSCULAR | Status: AC
  Filled 2015-05-31: qty 40

## 2015-05-31 MED ORDER — FENTANYL CITRATE (PF) 100 MCG/2ML IJ SOLN: 25.0000 ug | INTRAMUSCULAR | Status: DC | PRN

## 2015-05-31 MED ORDER — LACTATED RINGERS IV SOLN
INTRAVENOUS | Status: DC
  Administered 2015-05-31 (×2): via INTRAVENOUS

## 2015-05-31 SURGICAL SUPPLY — 12 items
CEMENT BONE KYPHON CDS (Cement) ×3 IMPLANT
DEVICE BIOPSY BONE KYPHX (INSTRUMENTS) ×3 IMPLANT
DRAPE C-ARM XRAY 36X54 (DRAPES) ×3 IMPLANT
DURAPREP 26ML APPLICATOR (WOUND CARE) ×3 IMPLANT
GLOVE SURG ORTHO 9.0 STRL STRW (GLOVE) ×3 IMPLANT
GOWN SPECIALTY ULTRA XL (MISCELLANEOUS) ×3 IMPLANT
GOWN STRL REUS W/ TWL LRG LVL3 (GOWN DISPOSABLE) ×1 IMPLANT
GOWN STRL REUS W/TWL LRG LVL3 (GOWN DISPOSABLE) ×2
LIQUID BAND (GAUZE/BANDAGES/DRESSINGS) ×3 IMPLANT
PACK KYPHOPLASTY (MISCELLANEOUS) ×3 IMPLANT
STRAP SAFETY BODY (MISCELLANEOUS) ×3 IMPLANT
TRAY KYPHOPAK 20/3 EXPRESS 1ST (MISCELLANEOUS) ×3 IMPLANT

## 2015-05-31 NOTE — Anesthesia Procedure Notes (Signed)
Performed by: COOK-MARTIN, Tondalaya Perren Pre-anesthesia Checklist: Patient identified, Emergency Drugs available, Suction available, Patient being monitored and Timeout performed Oxygen Delivery Method: Nasal cannula Preoxygenation: Pre-oxygenation with 100% oxygen Intubation Type: IV induction Placement Confirmation: positive ETCO2 and CO2 detector     

## 2015-05-31 NOTE — Anesthesia Preprocedure Evaluation (Signed)
Anesthesia Evaluation  Patient identified by MRN, date of birth, ID band Patient awake    Reviewed: Allergy & Precautions, H&P , NPO status , Patient's Chart, lab work & pertinent test results, reviewed documented beta blocker date and time   Airway Mallampati: II  TM Distance: >3 FB Neck ROM: full    Dental no notable dental hx.    Pulmonary neg pulmonary ROS,    Pulmonary exam normal breath sounds clear to auscultation       Cardiovascular Exercise Tolerance: Good hypertension, negative cardio ROS   Rhythm:regular Rate:Normal     Neuro/Psych negative neurological ROS  negative psych ROS   GI/Hepatic negative GI ROS, Neg liver ROS, GERD  ,  Endo/Other  negative endocrine ROSHypothyroidism   Renal/GU negative Renal ROS  negative genitourinary   Musculoskeletal   Abdominal   Peds  Hematology negative hematology ROS (+) anemia ,   Anesthesia Other Findings   Reproductive/Obstetrics negative OB ROS                             Anesthesia Physical Anesthesia Plan  ASA: III  Anesthesia Plan: General   Post-op Pain Management:    Induction:   Airway Management Planned:   Additional Equipment:   Intra-op Plan:   Post-operative Plan:   Informed Consent: I have reviewed the patients History and Physical, chart, labs and discussed the procedure including the risks, benefits and alternatives for the proposed anesthesia with the patient or authorized representative who has indicated his/her understanding and acceptance.   Dental Advisory Given  Plan Discussed with: CRNA  Anesthesia Plan Comments:         Anesthesia Quick Evaluation

## 2015-05-31 NOTE — H&P (Signed)
Reviewed paper H+P, will be scanned into chart. No changes noted.  

## 2015-05-31 NOTE — Transfer of Care (Signed)
Immediate Anesthesia Transfer of Care Note  Patient: Destiny Burns  Procedure(s) Performed: Procedure(s): KYPHOPLASTY L3 (N/A)  Patient Location: PACU  Anesthesia Type:General  Level of Consciousness: awake, alert , oriented and sedated  Airway & Oxygen Therapy: Patient Spontanous Breathing and Patient connected to nasal cannula oxygen  Post-op Assessment: Report given to RN and Post -op Vital signs reviewed and stable  Post vital signs: Reviewed and stable  Last Vitals:  Filed Vitals:   05/31/15 1318  BP: 159/98  Pulse: 71  Temp: 36.7 C  Resp: 16    Complications: No apparent anesthesia complications

## 2015-05-31 NOTE — Op Note (Signed)
05/31/2015  3:27 PM  PATIENT:  Destiny Burns  81 y.o. female  PRE-OPERATIVE DIAGNOSIS:  L3 COMPRESSION FX  POST-OPERATIVE DIAGNOSIS:  L3 COMPRESSION FX  PROCEDURE:  Procedure(s): KYPHOPLASTY L3 (N/A)  SURGEON: Laurene Footman, MD  ASSISTANTS: none  ANESTHESIA:   local and MAC  EBL:     BLOOD ADMINISTERED:none  DRAINS: none   LOCAL MEDICATIONS USED:  MARCAINE    and XYLOCAINE   SPECIMEN:  Source of Specimen:  L3 vertebral body  DISPOSITION OF SPECIMEN:  PATHOLOGY  COUNTS:  YES  TOURNIQUET:  * No tourniquets in log *  IMPLANTS: Bone cement  DICTATION: .Dragon Dictation patient was brought to the operating room and after adequate sedation was given, patient was placed prone. After appropriate patient identification and timeout procedure and having obtained good AP lateral images on the C-arm local anesthetic was infiltrated near the planned incisions. The back was then prepped and draped in sterile fashion and repeat timeout procedure carried out. Spinal needle was then used on both sides to get local down to the pedicle. Small incisions were made on each side and trochars advanced to the vertebral body through a transpedicular approach. Biopsy was obtained of the L3 body. Drilling was carried out and the balloons inserted. Both sides were inflated to get good correction with restoration of approximate 75% of the height of the vertebral body. Cement was then infiltrated on the left first on the right with very good interdigitation and fill of both sides with minimal extravasation. Trochars removed and the wounds closed with Dermabond.  PLAN OF CARE: Discharge to home after PACU  PATIENT DISPOSITION:  PACU - hemodynamically stable.

## 2015-05-31 NOTE — OR Nursing (Signed)
Dr Rosey Bath in , reviewed present ekg, sinus arrhythmia, no chest pain or other signs or symptons.

## 2015-05-31 NOTE — Discharge Instructions (Signed)

## 2015-06-01 ENCOUNTER — Encounter: Payer: Self-pay | Admitting: Orthopedic Surgery

## 2015-06-01 NOTE — Anesthesia Postprocedure Evaluation (Signed)
  Anesthesia Post-op Note  Patient: Naval architect  Procedure(s) Performed: Procedure(s): KYPHOPLASTY L3 (N/A)  Anesthesia type:General  Patient location: PACU  Post pain: Pain level controlled  Post assessment: Post-op Vital signs reviewed, Patient's Cardiovascular Status Stable, Respiratory Function Stable, Patent Airway and No signs of Nausea or vomiting  Post vital signs: Reviewed and stable  Last Vitals:  Filed Vitals:   05/31/15 1700  BP: 103/69  Pulse: 73  Temp:   Resp: 14    Level of consciousness: awake, alert  and patient cooperative  Complications: No apparent anesthesia complications  Patient was noted to have an irregular rhythm on her post-op EKG strip, so a 12-lead was obtained.  The 12-lead showed sinus arrhythmia with 1st degree AV block.  Patient denied symptoms of chest pain, shortness of breath or palpitations.  Vital sign were stable throughout.  It was felt that patient was stable for discharge home.

## 2015-06-02 ENCOUNTER — Ambulatory Visit: Admit: 2015-06-02 | Payer: Self-pay | Admitting: Orthopedic Surgery

## 2015-06-02 LAB — SURGICAL PATHOLOGY

## 2015-06-02 SURGERY — KYPHOPLASTY
Anesthesia: Choice

## 2015-06-17 ENCOUNTER — Telehealth: Payer: Self-pay | Admitting: Internal Medicine

## 2015-06-17 NOTE — Telephone Encounter (Signed)
Pt called about the sodium(levothyroxine (SYNTHROID, LEVOTHROID) 25 MCG tablet) medication that she's taking she thinks that it's causing her hair to fall out. She wants to know if it's doing that if so she does not want to take it anymore. Thank You!

## 2015-06-19 NOTE — Telephone Encounter (Signed)
No. The medication is not causing hair loss. She needs to be seen in a follow up visit.

## 2015-06-21 NOTE — Telephone Encounter (Signed)
Advised pt that the levothyroxine was not causing her hair loss, that she does need to continue to take the medication and once she is healed from her recent surgery she can schedule an appt to discuss her concerns about hair loss.

## 2015-06-21 NOTE — Telephone Encounter (Signed)
Pt called stating that she hadn't been contact about this issues.. I told her that Dr. Gilford Rile wanted her to make an appointment.. Pt stated that she just had back surgery and it was going to be difficult for her to make an appointment at this time. She also wanted to know if she still needed to take the medication.. Please advise pt.

## 2015-06-29 ENCOUNTER — Encounter: Payer: Self-pay | Admitting: Nurse Practitioner

## 2015-06-29 ENCOUNTER — Ambulatory Visit (INDEPENDENT_AMBULATORY_CARE_PROVIDER_SITE_OTHER): Payer: Medicare HMO | Admitting: Nurse Practitioner

## 2015-06-29 VITALS — BP 138/78 | HR 63 | Temp 97.7°F | Resp 12 | Ht 63.5 in | Wt 146.2 lb

## 2015-06-29 DIAGNOSIS — N3090 Cystitis, unspecified without hematuria: Secondary | ICD-10-CM | POA: Diagnosis not present

## 2015-06-29 LAB — POCT URINALYSIS DIPSTICK
BILIRUBIN UA: NEGATIVE
GLUCOSE UA: NEGATIVE
Ketones, UA: NEGATIVE
NITRITE UA: NEGATIVE
Protein, UA: NEGATIVE
Spec Grav, UA: 1.01
Urobilinogen, UA: 0.2
pH, UA: 7

## 2015-06-29 MED ORDER — CEPHALEXIN 500 MG PO CAPS: 500.0000 mg | ORAL_CAPSULE | Freq: Three times a day (TID) | ORAL | Status: DC

## 2015-06-29 NOTE — Progress Notes (Signed)
Patient ID: William Torsiello, female    DOB: 1926/11/25  Age: 81 y.o. MRN: [id number]  CC: Cystitis   HPI Ziya Fones presents for Urinary frequency and lower abdominal and back pain x 2 days.   1) Started yesterday with frequency, stomach pain, back pain   Back surgery 2 months ago- but feels increased pain wise recently Cranberry juice started last night  History Ariyah has a past medical history of Hypertension; Thyroid disease; Dupuytren contracture; Hypothyroidism; GERD (gastroesophageal reflux disease); Arthritis; Cancer ([place]); and Anemia.   She has past surgical history that includes Tonsillectomy; Dilation and curettage of uterus; Abdominal hysterectomy; Hand surgery; and Kyphoplasty (N/A, 05/31/2015).   Her family history includes Dupuytren's contracture in her brother and mother; Hypertension in her brother.She reports that she has never smoked. She has never used smokeless tobacco. She reports that she does not drink alcohol or use illicit drugs.  Outpatient Prescriptions Prior to Visit  Medication Sig Dispense Refill  . acetaminophen (TYLENOL) 325 MG tablet Take 650 mg by mouth every 6 (six) hours as needed.    Marland Kitchen aspirin 81 MG tablet Take 81 mg by mouth daily.      . calcium carbonate (CALCIUM 600) 600 MG TABS tablet Take 600 mg by mouth 2 (two) times daily with a meal.     . CALCIUM PO Take 1 tablet by mouth 3 (three) times daily.      . cholecalciferol (VITAMIN D) 1000 UNITS tablet Take 1,000 Units by mouth daily.      Marland Kitchen docusate sodium (COLACE) 100 MG capsule Take 100 mg by mouth daily.     . hydrochlorothiazide (HYDRODIURIL) 25 MG tablet TAKE 1 TABLET EVERY DAY 90 tablet 3  . HYDROcodone-acetaminophen (NORCO) 5-325 MG tablet Take 1 tablet by mouth every 6 (six) hours as needed for moderate pain. 30 tablet 0  . lactulose (CHRONULAC) 10 GM/15ML solution Take 30 mLs (20 g total) by mouth 3 (three) times daily. Stop once you have a bowel movement 120 mL 0  .  levothyroxine (SYNTHROID, LEVOTHROID) 25 MCG tablet TAKE 1 TABLET EVERY DAY 90 tablet 1  . loratadine (CLARITIN) 10 MG tablet Take 10 mg by mouth.    Marland Kitchen LORazepam (ATIVAN) 0.5 MG tablet Take 0.5 mg by mouth at bedtime as needed (RLS).     Marland Kitchen psyllium (METAMUCIL) 58.6 % powder Take 1 packet by mouth 3 (three) times daily.    . traMADol (ULTRAM) 50 MG tablet Take 1 tablet (50 mg total) by mouth every 8 (eight) hours as needed. 30 tablet 0  . urea (CARMOL) 40 % CREA Apply to toenails once nightly after soaking feet    . BONIVA 150 MG tablet TAKE 1 TABLET BY MOUTH ONCE MONTHLY (Patient not taking: Reported on 06/29/2015) 1 tablet 6   No facility-administered medications prior to visit.    ROS Review of Systems  Constitutional: Negative for fever, chills, diaphoresis and fatigue.  Genitourinary: Positive for dysuria, urgency and frequency. Negative for hematuria, flank pain, decreased urine volume, difficulty urinating and pelvic pain.  Skin: Negative for rash.    Objective:  BP 138/78 mmHg  Pulse 63  Temp(Src) 97.7 F (36.5 C)  Resp 12  Ht 5' 3.5" (1.613 m)  Wt 146 lb 3.2 oz (66.316 kg)  BMI 25.49 kg/m2  SpO2 97%  Physical Exam  Constitutional: She is oriented to person, place, and time. She appears well-developed and well-nourished. No distress.  HENT:  Head: Normocephalic and atraumatic.  Right Ear: External  ear normal.  Left Ear: External ear normal.  Abdominal: There is no CVA tenderness.  Neurological: She is alert and oriented to person, place, and time.  Skin: Skin is warm and dry. No rash noted. She is not diaphoretic.  Psychiatric: She has a normal mood and affect. Her behavior is normal. Judgment and thought content normal.   Assessment & Plan:   Laikyn was seen today for cystitis.  Diagnoses and all orders for this visit:  Cystitis -     POCT Urinalysis Dipstick  Other orders -     cephALEXin (KEFLEX) 500 MG capsule; Take 1 capsule (500 mg total) by mouth 3  (three) times daily.   I am having Ms. Gilman start on cephALEXin. I am also having her maintain her aspirin, cholecalciferol, CALCIUM PO, hydrochlorothiazide, levothyroxine, urea, calcium carbonate, loratadine, LORazepam, BONIVA, traMADol, psyllium, docusate sodium, lactulose, acetaminophen, and HYDROcodone-acetaminophen.  Meds ordered this encounter  Medications  . cephALEXin (KEFLEX) 500 MG capsule    Sig: Take 1 capsule (500 mg total) by mouth 3 (three) times daily.    Dispense:  21 capsule    Refill:  0    Order Specific Question:  Supervising Provider    Answer:  Crecencio Mc [[id number]]     Follow-up: Return if symptoms worsen or fail to improve.

## 2015-06-29 NOTE — Progress Notes (Signed)
Pre visit review using our clinic review tool, if applicable. No additional management support is needed unless otherwise documented below in the visit note. 

## 2015-06-29 NOTE — Patient Instructions (Signed)
Please visit the pharmacy for your antibiotic.   Take 3 times a day for 7 days.  Call us if anything changes

## 2015-07-01 LAB — URINE CULTURE

## 2015-07-20 DIAGNOSIS — Z9889 Other specified postprocedural states: Secondary | ICD-10-CM | POA: Diagnosis not present

## 2015-07-20 DIAGNOSIS — Z780 Asymptomatic menopausal state: Secondary | ICD-10-CM | POA: Diagnosis not present

## 2015-07-21 ENCOUNTER — Telehealth: Payer: Self-pay | Admitting: *Deleted

## 2015-07-21 ENCOUNTER — Telehealth: Payer: Self-pay | Admitting: Internal Medicine

## 2015-07-21 ENCOUNTER — Other Ambulatory Visit: Payer: Self-pay

## 2015-07-21 MED ORDER — LEVOTHYROXINE SODIUM 25 MCG PO TABS: 25.0000 ug | ORAL_TABLET | Freq: Every day | ORAL | Status: AC

## 2015-07-21 MED ORDER — HYDROCHLOROTHIAZIDE 25 MG PO TABS: 25.0000 mg | ORAL_TABLET | Freq: Every day | ORAL | Status: DC

## 2015-07-21 NOTE — Telephone Encounter (Signed)
Patient requested a call back about her bone density,results. Please Advise

## 2015-07-21 NOTE — Telephone Encounter (Signed)
I have not received any bone density results, nor did I order a bone density?

## 2015-07-21 NOTE — Telephone Encounter (Signed)
Please advise 

## 2015-07-21 NOTE — Telephone Encounter (Signed)
Completed.

## 2015-07-21 NOTE — Telephone Encounter (Signed)
Pt called about needing medication refills for hydrochlorothiazide (HYDRODIURIL) 25 MG tablet and levothyroxine (SYNTHROID, LEVOTHROID) 25 MCG tablet. Pharmacy is [facility] [place]. Thank You!

## 2015-07-22 NOTE — Telephone Encounter (Signed)
Please let patient know that a done density has not been ordered or completed.  Please advise her that she will need a office visit if that is wanted to discuss.  Thanks

## 2015-07-25 NOTE — Telephone Encounter (Signed)
Please advise? Do you want me to schedule a visit with her to discuss?

## 2015-07-25 NOTE — Telephone Encounter (Signed)
She should follow up with Dr. Gabriel Carina, as they ordered the test. I do not have results

## 2015-07-25 NOTE — Telephone Encounter (Signed)
Can you please call the patient and let her know to follow up with the other Dr. Marina Gravel

## 2015-07-25 NOTE — Telephone Encounter (Signed)
Patient stated that she received a message from Dr. Joycie Peek office at [facility],stating that she has low bone mass. Please advise

## 2015-08-19 ENCOUNTER — Other Ambulatory Visit: Payer: Self-pay

## 2015-08-19 ENCOUNTER — Ambulatory Visit (INDEPENDENT_AMBULATORY_CARE_PROVIDER_SITE_OTHER): Payer: Medicare HMO | Admitting: Internal Medicine

## 2015-08-19 ENCOUNTER — Encounter: Payer: Self-pay | Admitting: Internal Medicine

## 2015-08-19 ENCOUNTER — Telehealth: Payer: Self-pay | Admitting: Internal Medicine

## 2015-08-19 VITALS — BP 106/64 | HR 70 | Temp 97.7°F | Ht 63.5 in | Wt 148.1 lb

## 2015-08-19 DIAGNOSIS — R109 Unspecified abdominal pain: Secondary | ICD-10-CM | POA: Diagnosis not present

## 2015-08-19 DIAGNOSIS — N3000 Acute cystitis without hematuria: Secondary | ICD-10-CM | POA: Diagnosis not present

## 2015-08-19 DIAGNOSIS — R10A0 Flank pain, unspecified side: Secondary | ICD-10-CM

## 2015-08-19 LAB — POCT URINALYSIS DIPSTICK
BILIRUBIN UA: NEGATIVE
GLUCOSE UA: NEGATIVE
Ketones, UA: NEGATIVE
Nitrite, UA: POSITIVE
PH UA: 7
Protein, UA: NEGATIVE
SPEC GRAV UA: 1.015
UROBILINOGEN UA: 0.2

## 2015-08-19 MED ORDER — CEPHALEXIN 500 MG PO CAPS: 500.0000 mg | ORAL_CAPSULE | Freq: Three times a day (TID) | ORAL | Status: DC

## 2015-08-19 NOTE — Telephone Encounter (Signed)
Sent to new Pharmacy.

## 2015-08-19 NOTE — Progress Notes (Signed)
Subjective:    Patient ID: Destiny Burns, female    DOB: 15-Aug-1926, 82 y.o.   MRN: [id number]  HPI  82YO female presents for acute visit.  Dysuria - Mild burning with urination. Started yesterday. Pelvic pressure, low back pain. Urine is dark. No fever, chills. No flank pain. Not taking anything for this.  Wt Readings from Last 3 Encounters:  08/19/15 148 lb 2 oz (67.189 kg)  06/29/15 146 lb 3.2 oz (66.316 kg)  05/31/15 148 lb (67.132 kg)   BP Readings from Last 3 Encounters:  08/19/15 106/64  06/29/15 138/78  05/31/15 103/69    Past Medical History  Diagnosis Date  . Hypertension   . Thyroid disease   . Dupuytren contracture   . Hypothyroidism   . GERD (gastroesophageal reflux disease)     no meds  . Arthritis   . Cancer ([place])     basal cell  . Anemia     as a child   Family History  Problem Relation Age of Onset  . Dupuytren's contracture Mother   . Dupuytren's contracture Brother   . Hypertension Brother    Past Surgical History  Procedure Laterality Date  . Tonsillectomy    . Dilation and curettage of uterus    . Abdominal hysterectomy    . Hand surgery    . Kyphoplasty N/A 05/31/2015    Procedure: KYPHOPLASTY L3;  Surgeon: Hessie Knows, MD;  Location: [facility] ORS;  Service: Orthopedics;  Laterality: N/A;   Social History   Social History  . Marital Status: Widowed    Spouse Name: N/A  . Number of Children: N/A  . Years of Education: N/A   Social History Main Topics  . Smoking status: Never Smoker   . Smokeless tobacco: Never Used  . Alcohol Use: No  . Drug Use: No  . Sexual Activity: Not Asked   Other Topics Concern  . None   Social History Narrative    Review of Systems  Constitutional: Negative for fever, chills and fatigue.  Gastrointestinal: Negative for nausea, vomiting, abdominal pain, diarrhea, constipation and rectal pain.  Genitourinary: Positive for dysuria, urgency, hematuria and pelvic pain. Negative for frequency, flank  pain, decreased urine volume, vaginal bleeding, vaginal discharge, difficulty urinating and vaginal pain.  Musculoskeletal: Positive for back pain.       Objective:    BP 106/64 mmHg  Pulse 70  Temp(Src) 97.7 F (36.5 C) (Oral)  Ht 5' 3.5" (1.613 m)  Wt 148 lb 2 oz (67.189 kg)  BMI 25.82 kg/m2  SpO2 99% Physical Exam  Constitutional: She is oriented to person, place, and time. She appears well-developed and well-nourished. No distress.  HENT:  Head: Normocephalic and atraumatic.  Right Ear: External ear normal.  Left Ear: External ear normal.  Nose: Nose normal.  Mouth/Throat: Oropharynx is clear and moist. No oropharyngeal exudate.  Eyes: Conjunctivae are normal. Pupils are equal, round, and reactive to light. Right eye exhibits no discharge. Left eye exhibits no discharge. No scleral icterus.  Neck: Normal range of motion. Neck supple. No tracheal deviation present. No thyromegaly present.  Cardiovascular: Normal rate, regular rhythm, normal heart sounds and intact distal pulses.  Exam reveals no gallop and no friction rub.   No murmur heard. Pulmonary/Chest: Effort normal and breath sounds normal. No respiratory distress. She has no wheezes. She has no rales. She exhibits no tenderness.  Musculoskeletal: Normal range of motion. She exhibits no edema or tenderness.  Lymphadenopathy:    She has  no cervical adenopathy.  Neurological: She is alert and oriented to person, place, and time. No cranial nerve deficit. She exhibits normal muscle tone. Coordination normal.  Skin: Skin is warm and dry. No rash noted. She is not diaphoretic. No erythema. No pallor.  Psychiatric: She has a normal mood and affect. Her behavior is normal. Judgment and thought content normal.          Assessment & Plan:   Problem List Items Addressed This Visit      Unprioritized   Acute cystitis without hematuria - Primary    Symptoms and UA c/w UTI. Will send urine for culture. Will start Keflex, as  previous UTI sensitive to this in 06/2015. Encouraged fluid intake. Azo prn. Follow up if symptoms are not improving.      Relevant Medications   cephALEXin (KEFLEX) 500 MG capsule    Other Visit Diagnoses    Flank pain        Relevant Orders    POCT Urinalysis Dipstick (Completed)        Return if symptoms worsen or fail to improve.

## 2015-08-19 NOTE — Patient Instructions (Addendum)
Start Keflex three times daily for urinary tract infection. Increase fluid intake. Follow up if symptoms not improving.  Urinary Tract Infection Urinary tract infections (UTIs) can develop anywhere along your urinary tract. Your urinary tract is your body's drainage system for removing wastes and extra water. Your urinary tract includes two kidneys, two ureters, a bladder, and a urethra. Your kidneys are a pair of bean-shaped organs. Each kidney is about the size of your fist. They are located below your ribs, one on each side of your spine. CAUSES Infections are caused by microbes, which are microscopic organisms, including fungi, viruses, and bacteria. These organisms are so small that they can only be seen through a microscope. Bacteria are the microbes that most commonly cause UTIs. SYMPTOMS  Symptoms of UTIs may vary by age and gender of the patient and by the location of the infection. Symptoms in young women typically include a frequent and intense urge to urinate and a painful, burning feeling in the bladder or urethra during urination. Older women and men are more likely to be tired, shaky, and weak and have muscle aches and abdominal pain. A fever may mean the infection is in your kidneys. Other symptoms of a kidney infection include pain in your back or sides below the ribs, nausea, and vomiting. DIAGNOSIS To diagnose a UTI, your caregiver will ask you about your symptoms. Your caregiver will also ask you to provide a urine sample. The urine sample will be tested for bacteria and white blood cells. White blood cells are made by your body to help fight infection. TREATMENT  Typically, UTIs can be treated with medication. Because most UTIs are caused by a bacterial infection, they usually can be treated with the use of antibiotics. The choice of antibiotic and length of treatment depend on your symptoms and the type of bacteria causing your infection. HOME CARE INSTRUCTIONS  If you were  prescribed antibiotics, take them exactly as your caregiver instructs you. Finish the medication even if you feel better after you have only taken some of the medication.  Drink enough water and fluids to keep your urine clear or pale yellow.  Avoid caffeine, tea, and carbonated beverages. They tend to irritate your bladder.  Empty your bladder often. Avoid holding urine for long periods of time.  Empty your bladder before and after sexual intercourse.  After a bowel movement, women should cleanse from front to back. Use each tissue only once. SEEK MEDICAL CARE IF:   You have back pain.  You develop a fever.  Your symptoms do not begin to resolve within 3 days. SEEK IMMEDIATE MEDICAL CARE IF:   You have severe back pain or lower abdominal pain.  You develop chills.  You have nausea or vomiting.  You have continued burning or discomfort with urination. MAKE SURE YOU:   Understand these instructions.  Will watch your condition.  Will get help right away if you are not doing well or get worse.   This information is not intended to replace advice given to you by your health care provider. Make sure you discuss any questions you have with your health care provider.   Document Released: 05/09/2005 Document Revised: 04/20/2015 Document Reviewed: 09/07/2011 Elsevier Interactive Patient Education [employer].

## 2015-08-19 NOTE — Progress Notes (Signed)
Pre visit review using our clinic review tool, if applicable. No additional management support is needed unless otherwise documented below in the visit note. 

## 2015-08-19 NOTE — Assessment & Plan Note (Signed)
Symptoms and UA c/w UTI. Will send urine for culture. Will start Keflex, as previous UTI sensitive to this in 06/2015. Encouraged fluid intake. Azo prn. Follow up if symptoms are not improving.

## 2015-08-19 NOTE — Addendum Note (Signed)
Addended by: Karlene Einstein D on: 08/19/2015 10:22 AM   Modules accepted: Orders

## 2015-08-19 NOTE — Telephone Encounter (Signed)
Pt called and stated that she told Dr. Gilford Rile the wrong pharmacy for her rx today. She said that she needs her rx sent to [facility] [address] please. Please resend.

## 2015-08-22 LAB — URINE CULTURE

## 2015-08-26 ENCOUNTER — Telehealth: Payer: Self-pay | Admitting: Internal Medicine

## 2015-08-26 NOTE — Telephone Encounter (Signed)
Spoke with the patient.  See result note for details.

## 2015-08-26 NOTE — Telephone Encounter (Signed)
Pt called wanting to see if she could get the results from her culture that was done on 08/19/15.Marland Kitchen Please advise pt @ [phone]

## 2015-09-16 ENCOUNTER — Telehealth: Payer: Self-pay | Admitting: Internal Medicine

## 2015-09-16 DIAGNOSIS — R7309 Other abnormal glucose: Secondary | ICD-10-CM | POA: Insufficient documentation

## 2015-09-16 DIAGNOSIS — M546 Pain in thoracic spine: Secondary | ICD-10-CM | POA: Diagnosis not present

## 2015-09-16 DIAGNOSIS — I10 Essential (primary) hypertension: Secondary | ICD-10-CM | POA: Insufficient documentation

## 2015-09-16 DIAGNOSIS — I4891 Unspecified atrial fibrillation: Secondary | ICD-10-CM | POA: Diagnosis not present

## 2015-09-16 DIAGNOSIS — E039 Hypothyroidism, unspecified: Secondary | ICD-10-CM | POA: Diagnosis not present

## 2015-09-16 DIAGNOSIS — Z0000 Encounter for general adult medical examination without abnormal findings: Secondary | ICD-10-CM | POA: Diagnosis not present

## 2015-09-16 DIAGNOSIS — Z79899 Other long term (current) drug therapy: Secondary | ICD-10-CM | POA: Diagnosis not present

## 2015-09-16 DIAGNOSIS — K219 Gastro-esophageal reflux disease without esophagitis: Secondary | ICD-10-CM | POA: Insufficient documentation

## 2015-09-16 DIAGNOSIS — F411 Generalized anxiety disorder: Secondary | ICD-10-CM | POA: Insufficient documentation

## 2015-09-16 DIAGNOSIS — M159 Polyosteoarthritis, unspecified: Secondary | ICD-10-CM | POA: Insufficient documentation

## 2015-09-16 DIAGNOSIS — M79671 Pain in right foot: Secondary | ICD-10-CM | POA: Diagnosis not present

## 2015-09-16 DIAGNOSIS — C4491 Basal cell carcinoma of skin, unspecified: Secondary | ICD-10-CM | POA: Insufficient documentation

## 2015-09-16 NOTE — Telephone Encounter (Signed)
Left msg to call office to schedule AWV with Denisa/msn °

## 2015-09-19 ENCOUNTER — Other Ambulatory Visit: Payer: Self-pay | Admitting: Internal Medicine

## 2015-09-19 DIAGNOSIS — Z1231 Encounter for screening mammogram for malignant neoplasm of breast: Secondary | ICD-10-CM

## 2015-09-20 ENCOUNTER — Ambulatory Visit
Admission: RE | Admit: 2015-09-20 | Discharge: 2015-09-20 | Disposition: A | Discharge status: Home / Self Care | Payer: Medicare HMO | Source: Ambulatory Visit | Attending: Internal Medicine | Admitting: Internal Medicine

## 2015-09-20 DIAGNOSIS — Z1231 Encounter for screening mammogram for malignant neoplasm of breast: Secondary | ICD-10-CM | POA: Diagnosis not present

## 2015-09-22 DIAGNOSIS — R600 Localized edema: Secondary | ICD-10-CM | POA: Insufficient documentation

## 2015-09-22 DIAGNOSIS — I10 Essential (primary) hypertension: Secondary | ICD-10-CM | POA: Diagnosis not present

## 2015-09-22 DIAGNOSIS — I480 Paroxysmal atrial fibrillation: Secondary | ICD-10-CM | POA: Insufficient documentation

## 2015-09-22 DIAGNOSIS — R0681 Apnea, not elsewhere classified: Secondary | ICD-10-CM | POA: Insufficient documentation

## 2015-09-22 DIAGNOSIS — R0602 Shortness of breath: Secondary | ICD-10-CM | POA: Diagnosis not present

## 2015-09-26 ENCOUNTER — Other Ambulatory Visit: Payer: Self-pay | Admitting: Internal Medicine

## 2015-09-26 DIAGNOSIS — Z1211 Encounter for screening for malignant neoplasm of colon: Secondary | ICD-10-CM | POA: Diagnosis not present

## 2015-09-26 DIAGNOSIS — R928 Other abnormal and inconclusive findings on diagnostic imaging of breast: Secondary | ICD-10-CM

## 2015-09-28 DIAGNOSIS — R0600 Dyspnea, unspecified: Secondary | ICD-10-CM | POA: Diagnosis not present

## 2015-09-28 DIAGNOSIS — R300 Dysuria: Secondary | ICD-10-CM | POA: Diagnosis not present

## 2015-09-30 DIAGNOSIS — I10 Essential (primary) hypertension: Secondary | ICD-10-CM | POA: Diagnosis not present

## 2015-10-06 DIAGNOSIS — R600 Localized edema: Secondary | ICD-10-CM | POA: Diagnosis not present

## 2015-10-06 DIAGNOSIS — R0602 Shortness of breath: Secondary | ICD-10-CM | POA: Diagnosis not present

## 2015-10-06 DIAGNOSIS — I441 Atrioventricular block, second degree: Secondary | ICD-10-CM | POA: Insufficient documentation

## 2015-10-06 DIAGNOSIS — I10 Essential (primary) hypertension: Secondary | ICD-10-CM | POA: Diagnosis not present

## 2015-10-06 DIAGNOSIS — I480 Paroxysmal atrial fibrillation: Secondary | ICD-10-CM | POA: Diagnosis not present

## 2015-10-20 ENCOUNTER — Ambulatory Visit
Admission: RE | Admit: 2015-10-20 | Discharge: 2015-10-20 | Disposition: A | Discharge status: Home / Self Care | Payer: Medicare HMO | Source: Ambulatory Visit | Attending: Internal Medicine | Admitting: Internal Medicine

## 2015-10-20 DIAGNOSIS — R928 Other abnormal and inconclusive findings on diagnostic imaging of breast: Secondary | ICD-10-CM

## 2015-10-20 DIAGNOSIS — N63 Unspecified lump in breast: Secondary | ICD-10-CM | POA: Diagnosis not present

## 2015-10-20 DIAGNOSIS — N6489 Other specified disorders of breast: Secondary | ICD-10-CM | POA: Diagnosis not present

## 2015-10-25 ENCOUNTER — Other Ambulatory Visit: Payer: Self-pay | Admitting: Internal Medicine

## 2015-10-25 DIAGNOSIS — N63 Unspecified lump in unspecified breast: Secondary | ICD-10-CM

## 2015-11-08 ENCOUNTER — Ambulatory Visit
Admission: RE | Admit: 2015-11-08 | Discharge: 2015-11-08 | Disposition: A | Discharge status: Home / Self Care | Payer: Medicare HMO | Source: Ambulatory Visit | Attending: Internal Medicine | Admitting: Internal Medicine

## 2015-11-08 DIAGNOSIS — R1900 Intra-abdominal and pelvic swelling, mass and lump, unspecified site: Secondary | ICD-10-CM | POA: Diagnosis not present

## 2015-11-08 DIAGNOSIS — N63 Unspecified lump in unspecified breast: Secondary | ICD-10-CM

## 2015-11-08 DIAGNOSIS — N6002 Solitary cyst of left breast: Secondary | ICD-10-CM | POA: Diagnosis not present

## 2015-11-08 DIAGNOSIS — Z9889 Other specified postprocedural states: Secondary | ICD-10-CM | POA: Diagnosis not present

## 2015-11-08 HISTORY — PX: BREAST BIOPSY: SHX20

## 2015-11-09 LAB — SURGICAL PATHOLOGY

## 2015-12-05 DIAGNOSIS — N63 Unspecified lump in breast: Secondary | ICD-10-CM | POA: Diagnosis not present

## 2015-12-14 DIAGNOSIS — F411 Generalized anxiety disorder: Secondary | ICD-10-CM | POA: Diagnosis not present

## 2015-12-14 DIAGNOSIS — E039 Hypothyroidism, unspecified: Secondary | ICD-10-CM | POA: Diagnosis not present

## 2015-12-14 DIAGNOSIS — I10 Essential (primary) hypertension: Secondary | ICD-10-CM | POA: Diagnosis not present

## 2015-12-14 DIAGNOSIS — N390 Urinary tract infection, site not specified: Secondary | ICD-10-CM | POA: Diagnosis not present

## 2015-12-15 DIAGNOSIS — F411 Generalized anxiety disorder: Secondary | ICD-10-CM | POA: Diagnosis not present

## 2015-12-15 DIAGNOSIS — I10 Essential (primary) hypertension: Secondary | ICD-10-CM | POA: Diagnosis not present

## 2015-12-15 DIAGNOSIS — E039 Hypothyroidism, unspecified: Secondary | ICD-10-CM | POA: Diagnosis not present

## 2015-12-15 DIAGNOSIS — N390 Urinary tract infection, site not specified: Secondary | ICD-10-CM | POA: Diagnosis not present

## 2015-12-19 ENCOUNTER — Encounter: Payer: Self-pay | Admitting: Podiatry

## 2015-12-19 ENCOUNTER — Ambulatory Visit (INDEPENDENT_AMBULATORY_CARE_PROVIDER_SITE_OTHER): Payer: Commercial Managed Care - HMO | Admitting: Podiatry

## 2015-12-19 DIAGNOSIS — B351 Tinea unguium: Secondary | ICD-10-CM

## 2015-12-21 NOTE — Progress Notes (Signed)
She presents today for discoloration of her toenail.  Objective: Vital signs are stable alert 3. Toenails are simply dystrophic with some melanin pigmentation this does not appear to be of any concern.  Assessment: Onychomycosis.  Plan: Follow-up with me as needed.

## 2015-12-30 ENCOUNTER — Encounter: Payer: Self-pay | Admitting: *Deleted

## 2016-01-05 DIAGNOSIS — I480 Paroxysmal atrial fibrillation: Secondary | ICD-10-CM | POA: Diagnosis not present

## 2016-01-05 DIAGNOSIS — R0602 Shortness of breath: Secondary | ICD-10-CM | POA: Diagnosis not present

## 2016-01-05 DIAGNOSIS — I10 Essential (primary) hypertension: Secondary | ICD-10-CM | POA: Diagnosis not present

## 2016-01-05 DIAGNOSIS — R600 Localized edema: Secondary | ICD-10-CM | POA: Diagnosis not present

## 2016-01-05 DIAGNOSIS — I441 Atrioventricular block, second degree: Secondary | ICD-10-CM | POA: Diagnosis not present

## 2016-01-10 ENCOUNTER — Ambulatory Visit (INDEPENDENT_AMBULATORY_CARE_PROVIDER_SITE_OTHER): Payer: Commercial Managed Care - HMO | Admitting: Urology

## 2016-01-10 ENCOUNTER — Encounter: Payer: Self-pay | Admitting: Urology

## 2016-01-10 VITALS — BP 137/73 | HR 64 | Ht 63.0 in | Wt 152.7 lb

## 2016-01-10 DIAGNOSIS — R32 Unspecified urinary incontinence: Secondary | ICD-10-CM | POA: Diagnosis not present

## 2016-01-10 DIAGNOSIS — N390 Urinary tract infection, site not specified: Secondary | ICD-10-CM

## 2016-01-10 DIAGNOSIS — N952 Postmenopausal atrophic vaginitis: Secondary | ICD-10-CM

## 2016-01-10 MED ORDER — ESTROGENS, CONJUGATED 0.625 MG/GM VA CREA
1.0000 | TOPICAL_CREAM | Freq: Every day | VAGINAL | Status: DC
Start: 2016-01-10 — End: 2019-06-08

## 2016-01-10 MED ORDER — ESTRADIOL 0.1 MG/GM VA CREA: TOPICAL_CREAM | VAGINAL | Status: DC

## 2016-01-10 NOTE — Patient Instructions (Addendum)
I have given you two prescriptions for a vaginal estrogen cream.  Estrace and Premarin.  Please take these to your pharmacy and see which one your insurance covers.  If both are too expensive, please call the office at [phone] for an alternative.  You are given a sample of vaginal estrogen cream (Premarin/Estrace) and instructed to apply 0.5mg  (pea-sized amount)  just inside the vaginal introitus with a finger-tip every night for two weeks.   Mirabegron extended-release tablets What is this medicine? MIRABEGRON (MIR a BEG ron) is used to treat overactive bladder. This medicine reduces the amount of bathroom visits. It may also help to control wetting accidents. This medicine may be used for other purposes; ask your health care provider or pharmacist if you have questions. What should I tell my health care provider before I take this medicine? They need to know if you have any of these conditions: -difficulty passing urine -high blood pressure -kidney disease -liver disease -an unusual or allergic reaction to mirabegron, other medicines, foods, dyes, or preservatives -pregnant or trying to get pregnant -breast-feeding How should I use this medicine? Take this medicine by mouth with a glass of water. Follow the directions on the prescription label. Do not cut, crush or chew this medicine. You can take it with or without food. If it upsets your stomach, take it with food. Take your medicine at regular intervals. Do not take it more often than directed. Do not stop taking except on your doctor's advice. Talk to your pediatrician regarding the use of this medicine in children. Special care may be needed. Overdosage: If you think you have taken too much of this medicine contact a poison control center or emergency room at once. NOTE: This medicine is only for you. Do not share this medicine with others. What if I miss a dose? If you miss a dose, take it as soon as you can. If it is almost time  for your next dose, take only that dose. Do not take double or extra doses. What may interact with this medicine? -certain medicines for bladder problems like fesoterodine, oxybutynin, solifenacin, tolterodine -desipramine -digoxin -flecainide -ketoconazole -MAOIs like Carbex, Eldepryl, Marplan, Nardil, and Parnate -metoprolol -propafenone -thioridazine -warfarin This list may not describe all possible interactions. Give your health care provider a list of all the medicines, herbs, non-prescription drugs, or dietary supplements you use. Also tell them if you smoke, drink alcohol, or use illegal drugs. Some items may interact with your medicine. What should I watch for while using this medicine? It may take 8 weeks to notice the full benefit from this medicine. You may need to limit your intake tea, coffee, caffeinated sodas, and alcohol. These drinks may make your symptoms worse. Visit your doctor or health care professional for regular checks on your progress. Check your blood pressure as directed. Ask your doctor or health care professional what your blood pressure should be and when you should contact him or her. What side effects may I notice from receiving this medicine? Side effects that you should report to your doctor or health care professional as soon as possible: -allergic reactions like skin rash, itching or hives, swelling of the face, lips, or tongue -chest pain or palpitations -severe or sudden headache -high blood pressure -fast, irregular heartbeat -redness, blistering, peeling or loosening of the skin, including inside the mouth -signs of infection like fever or chills; cough; sore throat; pain or difficulty passing urine -trouble passing urine or change in the amount of  urine Side effects that usually do not require medical attention (Report these to your doctor or health care professional if they continue or are bothersome.): -constipation -diarrhea -dizziness -dry  eyes -joint pain -mild headache -nausea -runny nose This list may not describe all possible side effects. Call your doctor for medical advice about side effects. You may report side effects to FDA at 1-800-FDA-1088. Where should I keep my medicine? Keep out of the reach of children. Store at room temperature between 15 and 30 degrees C (59 and 86 degrees F). Throw away any unused medicine after the expiration date. NOTE: This sheet is a summary. It may not cover all possible information. If you have questions about this medicine, talk to your doctor, pharmacist, or health care provider.    2016, [employer]vier/Gold Standard. (2015-03-31 10:22:20)

## 2016-01-10 NOTE — Progress Notes (Signed)
01/10/2016 3:35 PM   Destiny Burns 01/18/1927 [id number]  Referring provider: Idelle Crouch, MD [facility] [place], [place] [address]  Chief Complaint  Patient presents with  . Recurrent UTI    new pt     HPI: Patient is a 82 year old Caucasian female who is referred to [place] by, Dr. Doy Hutching, for recurrent urinary tract infections.  Patient states that she has had too numerous to count urinary tract infections over the last year.  Her symptoms with a urinary tract infection consist of dysuria, frequency, lower back pain and feeling tired.  She denies gross hematuria, suprapubic pain, abdominal pain or flank pain.  She has not had any recent fevers, chills, nausea or vomiting.  She does not have a history of nephrolithiasis, GU surgery or GU trauma.  Reviewing her records,  she has had five positive urine cultures over the last year with E.coli with a variable resistant pattern.    She is not sexually active.  She denies constipation and diarrhea.  She does engage in good perineal hygiene.  She is postmenopausal.   She has incontinence.  She is using incontinence pads. She is not having pain with bladder filling.    She has/not had any recent imaging studies.    Her baseline urinary symptoms consist of frequency, urgency, nocturia, and incontinence. She states she had seen Dr. Madelin Headings in the distant past and had her urethra dilated in the office. Her UA today is unremarkable.  She is not having symptoms of an UTI at this time.      PMH: Past Medical History  Diagnosis Date  . Hypertension   . Thyroid disease   . Dupuytren contracture   . Hypothyroidism   . GERD (gastroesophageal reflux disease)     no meds  . Arthritis   . Anemia     as a child  . Cancer ([place])     basal cell  . Abnormal glucose   . GAD (generalized anxiety disorder)   . Atrial fibrillation ([place])   . Pedal edema   . Mobitz type 2 second degree atrioventricular block     . SOB (shortness of breath) on exertion     Surgical History: Past Surgical History  Procedure Laterality Date  . Tonsillectomy    . Dilation and curettage of uterus    . Abdominal hysterectomy    . Hand surgery    . Kyphoplasty N/A 05/31/2015    Procedure: KYPHOPLASTY L3;  Surgeon: Hessie Knows, MD;  Location: [facility] ORS;  Service: Orthopedics;  Laterality: N/A;  . Breast biopsy Left 11/08/15    Home Medications:    Medication List       This list is accurate as of: 01/10/16  3:35 PM.  Always use your most recent med list.               acetaminophen 325 MG tablet  Commonly known as:  TYLENOL  Take 650 mg by mouth every 6 (six) hours as needed. Reported on 01/10/2016     aspirin 81 MG tablet  Take 81 mg by mouth daily.     CALCIUM 600 600 MG Tabs tablet  Generic drug:  calcium carbonate  Take 600 mg by mouth 2 (two) times daily with a meal. Reported on 01/10/2016     cholecalciferol 1000 units tablet  Commonly known as:  VITAMIN D  Take 1,000 Units by mouth daily.     conjugated estrogens vaginal cream  Commonly known as:  PREMARIN  Place 1 Applicatorful vaginally daily. Apply 0.5mg  (pea-sized amount)  just inside the vaginal introitus with a finger-tip every night for two weeks and then Monday, Wednesday and Friday nights.     estradiol 0.1 MG/GM vaginal cream  Commonly known as:  ESTRACE VAGINAL  Apply 0.5mg  (pea-sized amount)  just inside the vaginal introitus with a finger-tip every night for two weeks and then Monday, Wednesday and Friday nights.     hydrochlorothiazide 25 MG tablet  Commonly known as:  HYDRODIURIL  Take 1 tablet (25 mg total) by mouth daily.     levothyroxine 25 MCG tablet  Commonly known as:  SYNTHROID, LEVOTHROID  Take 1 tablet (25 mcg total) by mouth daily.     loratadine 10 MG tablet  Commonly known as:  CLARITIN  Take 10 mg by mouth.        Allergies:  Allergies  Allergen Reactions  . Amoxicillin Swelling  . Sulfa  Antibiotics     Family History: Family History  Problem Relation Age of Onset  . Dupuytren's contracture Mother   . Breast cancer Mother 94  . Dupuytren's contracture Brother   . Hypertension Brother   . Breast cancer Sister 29  . Breast cancer Maternal Aunt 76    Social History:  reports that she has never smoked. She has never used smokeless tobacco. She reports that she does not drink alcohol or use illicit drugs.  ROS: UROLOGY Frequent Urination?: Yes Hard to postpone urination?: Yes Burning/pain with urination?: No Get up at night to urinate?: Yes Leakage of urine?: Yes Urine stream starts and stops?: No Trouble starting stream?: No Do you have to strain to urinate?: No Blood in urine?: No Urinary tract infection?: Yes Sexually transmitted disease?: No Injury to kidneys or bladder?: No Painful intercourse?: No Weak stream?: No Currently pregnant?: No Vaginal bleeding?: No Last menstrual period?: n  Gastrointestinal Nausea?: No Vomiting?: No Indigestion/heartburn?: No Diarrhea?: No Constipation?: No  Constitutional Fever: No Night sweats?: No Weight loss?: No Fatigue?: No  Skin Skin rash/lesions?: No Itching?: No  Eyes Blurred vision?: No Double vision?: No  Ears/Nose/Throat Sore throat?: No Sinus problems?: No  Hematologic/Lymphatic Swollen glands?: No Easy bruising?: No  Cardiovascular Leg swelling?: Yes Chest pain?: No  Respiratory Cough?: No Shortness of breath?: No  Endocrine Excessive thirst?: No  Musculoskeletal Back pain?: No Joint pain?: Yes  Neurological Headaches?: No Dizziness?: No  Psychologic Depression?: No Anxiety?: No  Physical Exam: BP 137/73 mmHg  Pulse 64  Ht 5\' 3"  (1.6 m)  Wt 152 lb 11.2 oz (69.264 kg)  BMI 27.06 kg/m2  Constitutional: Well nourished. Alert and oriented, No acute distress. HEENT: [place] AT, moist mucus membranes. Trachea midline, no masses. Cardiovascular: No clubbing, cyanosis, or  edema. Respiratory: Normal respiratory effort, no increased work of breathing. GI: Abdomen is soft, non tender, non distended, no abdominal masses. Liver and spleen not palpable.  No hernias appreciated.  Stool sample for occult testing is not indicated.   GU: No CVA tenderness.  No bladder fullness or masses.  Atrophic external genitalia, normal pubic hair distribution, no lesions.  Normal urethral meatus, no lesions, no prolapse, no discharge.   No urethral masses, tenderness and/or tenderness. No bladder fullness, tenderness or masses. Normal vagina mucosa, good estrogen effect, no discharge, no lesions, good pelvic support, no cystocele or rectocele noted.  Cervix, uterus and adnexa are surgically absent.  Anus and perineum are without rashes or lesions.    Skin:  No rashes, bruises or suspicious lesions. Lymph: No cervical or inguinal adenopathy. Neurologic: Grossly intact, no focal deficits, moving all 4 extremities. Psychiatric: Normal mood and affect.  Laboratory Data: Lab Results  Component Value Date   WBC 3.9* 07/09/2011   HGB 12.0 07/09/2011   HCT 35.6* 07/09/2011   MCV 91.4 07/09/2011   PLT 117.0* 07/09/2011    Lab Results  Component Value Date   CREATININE 0.68 05/31/2015     Lab Results  Component Value Date   TSH 2.44 08/25/2013       Component Value Date/Time   CHOL 199 06/05/2011 0813   HDL 60.30 06/05/2011 0813   CHOLHDL 3 06/05/2011 0813   VLDL 15.8 06/05/2011 0813   LDLCALC 123* 06/05/2011 0813    Lab Results  Component Value Date   AST 40* 08/25/2013   Lab Results  Component Value Date   ALT 18 08/25/2013     Urinalysis Results for orders placed or performed in visit on 01/10/16  Microscopic Examination  Result Value Ref Range   WBC, UA 0-5 0 -  5 /hpf   RBC, UA None seen 0 -  2 /hpf   Epithelial Cells (non renal) None seen 0 - 10 /hpf   Bacteria, UA None seen None seen/Few  Urinalysis, Complete  Result Value Ref Range   Specific  Gravity, UA 1.015 1.005 - 1.030   pH, UA 6.0 5.0 - 7.5   Color, UA Yellow Yellow   Appearance Ur Clear Clear   Leukocytes, UA Negative Negative   Protein, UA Negative Negative/Trace   Glucose, UA Negative Negative   Ketones, UA Negative Negative   RBC, UA Negative Negative   Bilirubin, UA Negative Negative   Urobilinogen, Ur 0.2 0.2 - 1.0 mg/dL   Nitrite, UA Negative Negative   Microscopic Examination See below:      Assessment & Plan:    1. Recurrent UTI:   UA is unremarkable at this time.  She is not experiencing symptoms at this time.  Patient is instructed to increase her water intake, take probiotics (yogurt, oral pills or vaginal suppositories), take cranberry pills or drink the juice and use the estrogen cream.    - Urinalysis, Complete  2. Vaginal atrophy:   I explained to the patient that when women go through menopause and her estrogen levels are severely diminished, the normal vaginal flora will change.  This is due to an increase of the vaginal canal's pH. Because of this, the vaginal canal may be colonized by bacteria from the rectum instead of the protective lactobacillus.  This accompanied by the loss of the mucus barrier with vaginal atrophy is a cause of recurrent urinary tract infections.  In some studies, it has been demonstrated that patients can experience a reduction in recurrent urinary tract infections to one a year.   Patient was given a sample of vaginal estrogen cream (Premarin) and instructed to apply 0.5mg  (pea-sized amount)  just inside the vaginal introitus with a finger-tip every night for two weeks and then Monday, Wednesday and Friday nights.  I explained to the patient that vaginally administered estrogen, which causes only a slight increase in the blood estrogen levels, have fewer contraindications and adverse systemic effects that oral HT.  I have also given prescriptions for the Estrace cream and Premarin cream, so that the patient may carry them to  the pharmacy to see which one of the branded creams would be most economical for her.  She will return in  2 weeks for symptom recheck, exam and report if she was available to require the vaginal cream by prescription.  3. Incontinence:   I have given the patient Myrbetriq 25 mg, #28 samples.  I have advised the patient of the side effects of Myrbetriq, such as: elevation in BP, urinary retention and/or HA.  She will be returning in 2 weeks for PVR and symptom recheck.    Return in about 2 weeks (around 01/24/2016) for PVR and exam.  These notes generated with voice recognition software. I apologize for typographical errors.  Zara Council, [facility] [address], [place], [place] [address] [phone]

## 2016-01-11 LAB — MICROSCOPIC EXAMINATION
BACTERIA UA: NONE SEEN
EPITHELIAL CELLS (NON RENAL): NONE SEEN /HPF (ref 0–10)
RBC, UA: NONE SEEN /hpf (ref 0–?)

## 2016-01-11 LAB — URINALYSIS, COMPLETE
Bilirubin, UA: NEGATIVE
Glucose, UA: NEGATIVE
KETONES UA: NEGATIVE
LEUKOCYTES UA: NEGATIVE
Nitrite, UA: NEGATIVE
Protein, UA: NEGATIVE
RBC, UA: NEGATIVE
SPEC GRAV UA: 1.015 (ref 1.005–1.030)
Urobilinogen, Ur: 0.2 mg/dL (ref 0.2–1.0)
pH, UA: 6 (ref 5.0–7.5)

## 2016-01-12 DIAGNOSIS — R32 Unspecified urinary incontinence: Secondary | ICD-10-CM | POA: Insufficient documentation

## 2016-01-12 DIAGNOSIS — N952 Postmenopausal atrophic vaginitis: Secondary | ICD-10-CM | POA: Insufficient documentation

## 2016-01-23 ENCOUNTER — Telehealth: Payer: Self-pay | Admitting: Radiology

## 2016-01-23 NOTE — Telephone Encounter (Signed)
Patient does not need to use both Premarin and Estrace cream.  The two scripts were given to see which one her insurance would cover.

## 2016-01-23 NOTE — Telephone Encounter (Signed)
Notified pt she should use Premarin OR Estrace cream, not both. Pt states both medications cost the same amount & she would talk with Larene Beach at her next appt. before using them.

## 2016-01-23 NOTE — Telephone Encounter (Signed)
Pt states she has been taking Myrbetriq, premarin & estradiol. States she couldn't tell if she had a rash but stopped all 3 medications because she "didn't feel right" on Friday. Now pt states she feels better & would like to talk to [facility] about this.  Pt has an appt with Larene Beach 01/24/16. Please advise.  Pt's return phone # is [phone].

## 2016-01-24 ENCOUNTER — Ambulatory Visit (INDEPENDENT_AMBULATORY_CARE_PROVIDER_SITE_OTHER): Payer: Commercial Managed Care - HMO | Admitting: Urology

## 2016-01-24 ENCOUNTER — Encounter: Payer: Self-pay | Admitting: Urology

## 2016-01-24 VITALS — BP 155/73 | HR 61 | Ht 63.0 in | Wt 151.4 lb

## 2016-01-24 DIAGNOSIS — R32 Unspecified urinary incontinence: Secondary | ICD-10-CM | POA: Diagnosis not present

## 2016-01-24 DIAGNOSIS — N952 Postmenopausal atrophic vaginitis: Secondary | ICD-10-CM

## 2016-01-24 DIAGNOSIS — N390 Urinary tract infection, site not specified: Secondary | ICD-10-CM | POA: Diagnosis not present

## 2016-01-24 LAB — BLADDER SCAN AMB NON-IMAGING: SCAN RESULT: 94

## 2016-01-24 NOTE — Progress Notes (Signed)
4:17 PM   Destiny Burns 1927/06/02 [id number]  Referring provider: Idelle Crouch, MD [facility] [place], [place] [address]  Chief Complaint  Patient presents with  . Recurrent UTI    2 weeks follow up  . Vaginal Atrophy    HPI: Patient is a 82 year old Caucasian female who presents today for 2 week follow-up after being placed on vaginal estrogen cream for vaginal atrophy, and recurrent UTI and placed on Myrbetriq for incontinence.  Background history Patient was referred to [place] by, Dr. Doy Hutching, for recurrent urinary tract infections.  Patient states that she has had too numerous to count urinary tract infections over the last year.  Her symptoms with a urinary tract infection consist of dysuria, frequency, lower back pain and feeling tired.  She denies gross hematuria, suprapubic pain, abdominal pain or flank pain.  She has not had any recent fevers, chills, nausea or vomiting.  She does not have a history of nephrolithiasis, GU surgery or GU trauma.  Reviewing her records,  she has had five positive urine cultures over the last year with E.coli with a variable resistant pattern.    She is not sexually active.  She denies constipation and diarrhea.  She does engage in good perineal hygiene.  She is postmenopausal.   She has incontinence.  She is using incontinence pads. She is not having pain with bladder filling.    She not had any recent imaging studies.    Her baseline urinary symptoms consist of frequency, urgency, nocturia, and incontinence. She states she had seen Dr. Madelin Headings in the distant past and had her urethra dilated in the office. Her UA today is unremarkable.    She had some confusion when she left the office and has been using both Premarin and Estrace cream nightly.  She did start the Myrbetriq 25 mg samples, but she discontinued the medication one week ago. She states that her urinary symptoms have abated and she is not having any  further incontinence.    Her PVR today is 94 mL.    PMH: Past Medical History  Diagnosis Date  . Hypertension   . Thyroid disease   . Dupuytren contracture   . Hypothyroidism   . GERD (gastroesophageal reflux disease)     no meds  . Arthritis   . Anemia     as a child  . Cancer ([place])     basal cell  . Abnormal glucose   . GAD (generalized anxiety disorder)   . Atrial fibrillation ([place])   . Pedal edema   . Mobitz type 2 second degree atrioventricular block   . SOB (shortness of breath) on exertion     Surgical History: Past Surgical History  Procedure Laterality Date  . Tonsillectomy    . Dilation and curettage of uterus    . Abdominal hysterectomy    . Hand surgery    . Kyphoplasty N/A 05/31/2015    Procedure: KYPHOPLASTY L3;  Surgeon: Hessie Knows, MD;  Location: [facility] ORS;  Service: Orthopedics;  Laterality: N/A;  . Breast biopsy Left 11/08/15    Home Medications:    Medication List       This list is accurate as of: 01/24/16  4:17 PM.  Always use your most recent med list.               acetaminophen 325 MG tablet  Commonly known as:  TYLENOL  Take 650 mg by mouth every 6 (six)  hours as needed. Reported on 01/24/2016     aspirin 81 MG tablet  Take 81 mg by mouth daily.     CALCIUM 600 600 MG Tabs tablet  Generic drug:  calcium carbonate  Take 600 mg by mouth 2 (two) times daily with a meal. Reported on 01/10/2016     cholecalciferol 1000 units tablet  Commonly known as:  VITAMIN D  Take 1,000 Units by mouth daily.     conjugated estrogens vaginal cream  Commonly known as:  PREMARIN  Place 1 Applicatorful vaginally daily. Apply 0.5mg  (pea-sized amount)  just inside the vaginal introitus with a finger-tip every night for two weeks and then Monday, Wednesday and Friday nights.     estradiol 0.1 MG/GM vaginal cream  Commonly known as:  ESTRACE VAGINAL  Apply 0.5mg  (pea-sized amount)  just inside the vaginal introitus with a finger-tip every night for  two weeks and then Monday, Wednesday and Friday nights.     hydrochlorothiazide 25 MG tablet  Commonly known as:  HYDRODIURIL  Take 1 tablet (25 mg total) by mouth daily.     levothyroxine 25 MCG tablet  Commonly known as:  SYNTHROID, LEVOTHROID  Take 1 tablet (25 mcg total) by mouth daily.     loratadine 10 MG tablet  Commonly known as:  CLARITIN  Take 10 mg by mouth.     MYRBETRIQ 25 MG Tb24 tablet  Generic drug:  mirabegron ER  Take 25 mg by mouth daily. Reported on 01/24/2016        Allergies:  Allergies  Allergen Reactions  . Amoxicillin Swelling  . Sulfa Antibiotics     Family History: Family History  Problem Relation Age of Onset  . Dupuytren's contracture Mother   . Breast cancer Mother 67  . Dupuytren's contracture Brother   . Hypertension Brother   . Breast cancer Sister 36  . Breast cancer Maternal Aunt 76    Social History:  reports that she has never smoked. She has never used smokeless tobacco. She reports that she does not drink alcohol or use illicit drugs.  ROS: UROLOGY Frequent Urination?: No Hard to postpone urination?: No Burning/pain with urination?: No Get up at night to urinate?: Yes Leakage of urine?: Yes Urine stream starts and stops?: No Trouble starting stream?: No Do you have to strain to urinate?: No Blood in urine?: No Urinary tract infection?: No Sexually transmitted disease?: No Injury to kidneys or bladder?: No Painful intercourse?: No Weak stream?: No Currently pregnant?: No Vaginal bleeding?: No Last menstrual period?: n  Gastrointestinal Nausea?: No Vomiting?: No Indigestion/heartburn?: No Diarrhea?: No Constipation?: No  Constitutional Fever: No Night sweats?: No Weight loss?: No Fatigue?: No  Skin Skin rash/lesions?: No Itching?: No  Eyes Blurred vision?: No Double vision?: No  Ears/Nose/Throat Sore throat?: No Sinus problems?: No  Hematologic/Lymphatic Swollen glands?: No Easy bruising?:  No  Cardiovascular Leg swelling?: Yes Chest pain?: No  Respiratory Cough?: No Shortness of breath?: No  Endocrine Excessive thirst?: No  Musculoskeletal Back pain?: No Joint pain?: No  Neurological Headaches?: No Dizziness?: No  Psychologic Depression?: No Anxiety?: No  Physical Exam: BP 155/73 mmHg  Pulse 61  Ht 5\' 3"  (1.6 m)  Wt 151 lb 6.4 oz (68.675 kg)  BMI 26.83 kg/m2  Constitutional: Well nourished. Alert and oriented, No acute distress. HEENT: Circle AT, moist mucus membranes. Trachea midline, no masses. Cardiovascular: No clubbing, cyanosis, or edema. Respiratory: Normal respiratory effort, no increased work of breathing. GI: Abdomen is soft, non  tender, non distended, no abdominal masses. Liver and spleen not palpable.  No hernias appreciated.  Stool sample for occult testing is not indicated.   GU: No CVA tenderness.  No bladder fullness or masses.  Atrophic external genitalia, normal pubic hair distribution, no lesions.  Normal urethral meatus, no lesions, no prolapse, no discharge.   No urethral masses, tenderness and/or tenderness. No bladder fullness, tenderness or masses. Normal vagina mucosa, good estrogen effect, no discharge, no lesions, good pelvic support, no cystocele or rectocele noted.  Cervix, uterus and adnexa are surgically absent.  Anus and perineum are without rashes or lesions.    Skin: No rashes, bruises or suspicious lesions. Lymph: No cervical or inguinal adenopathy. Neurologic: Grossly intact, no focal deficits, moving all 4 extremities. Psychiatric: Normal mood and affect.  Laboratory Data: Lab Results  Component Value Date   WBC 3.9* 07/09/2011   HGB 12.0 07/09/2011   HCT 35.6* 07/09/2011   MCV 91.4 07/09/2011   PLT 117.0* 07/09/2011    Lab Results  Component Value Date   CREATININE 0.68 05/31/2015     Lab Results  Component Value Date   TSH 2.44 08/25/2013       Component Value Date/Time   CHOL 199 06/05/2011 0813    HDL 60.30 06/05/2011 0813   CHOLHDL 3 06/05/2011 0813   VLDL 15.8 06/05/2011 0813   LDLCALC 123* 06/05/2011 0813    Lab Results  Component Value Date   AST 40* 08/25/2013   Lab Results  Component Value Date   ALT 18 08/25/2013     Pertinent imaging Results for orders placed or performed in visit on 01/24/16  BLADDER SCAN AMB NON-IMAGING  Result Value Ref Range   Scan Result 94      Assessment & Plan:    1. Recurrent UTI:   Patient is to continue to increase her water intake, take probiotics (yogurt, oral pills or vaginal suppositories), take cranberry pills or drink the juice and use the estrogen cream.    2. Vaginal atrophy:   Patient does not feel it necessary to continue vaginal cream at this time as her symptoms have resolved.  3. Incontinence:    Resolved.  Return if symptoms worsen or fail to improve.  These notes generated with voice recognition software. I apologize for typographical errors.  Zara Council, [facility] [address] [place], [place] [address] [phone]

## 2016-01-26 IMAGING — XA DG LUMBAR SPINE 2-3V
1 series · 1 of 1 positions shown · non-contrast
Comparison: MR lumbar spine of 05/24/2015

CLINICAL DATA: Kyphoplasty of the lumbar spine

EXAM:
LUMBAR SPINE - 2-3 VIEW

[Series 1: cont. · 1 of 1 slices shown]
[im 1/1]
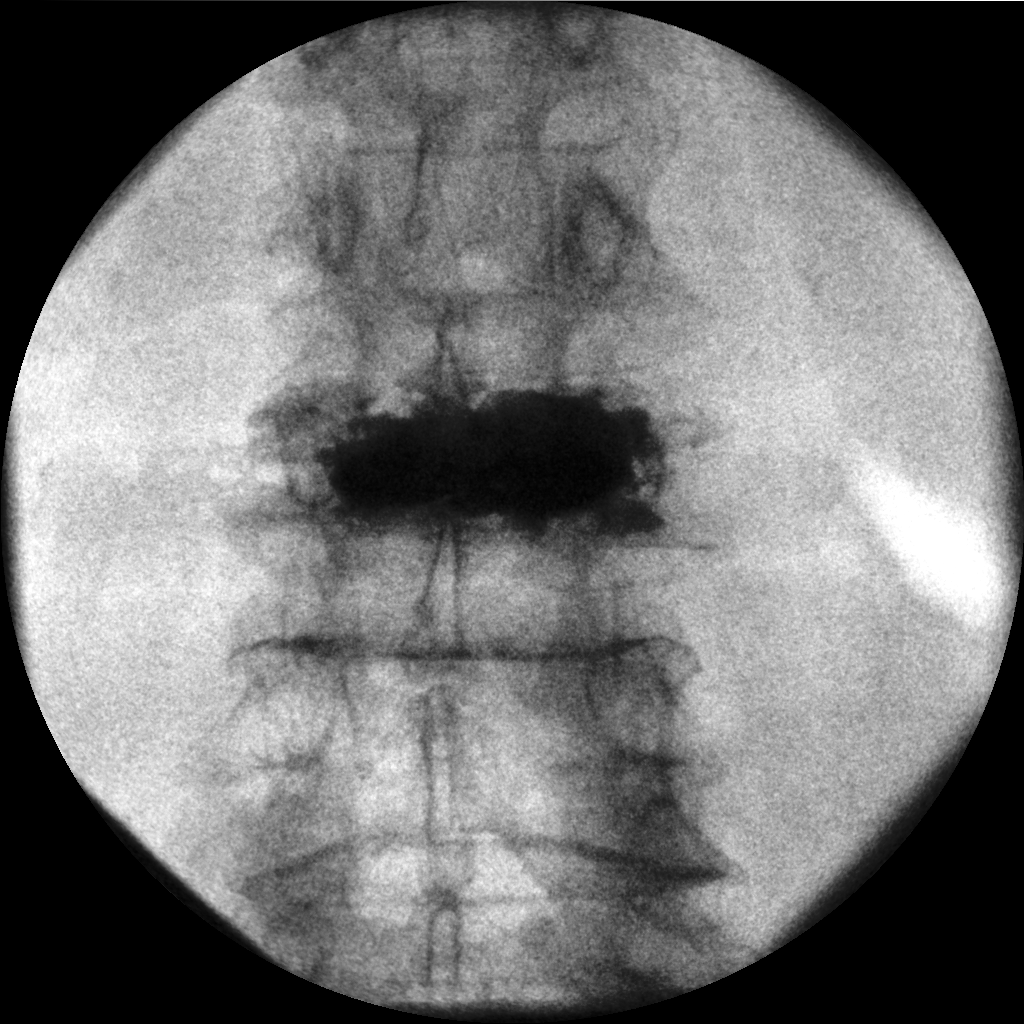

[1 of 1 positions shown; findings below may reference images not displayed]

FINDINGS: A single C-arm spot film was returned. A kyphoplasty has been
performed presumably at the L3 level when compared to the MR
although this C-arm image is not marked.
IMPRESSION: Kyphoplasty presumably at L3.

## 2016-02-29 DIAGNOSIS — L739 Follicular disorder, unspecified: Secondary | ICD-10-CM | POA: Diagnosis not present

## 2016-03-14 DIAGNOSIS — L570 Actinic keratosis: Secondary | ICD-10-CM | POA: Diagnosis not present

## 2016-03-14 DIAGNOSIS — D485 Neoplasm of uncertain behavior of skin: Secondary | ICD-10-CM | POA: Diagnosis not present

## 2016-03-14 DIAGNOSIS — L720 Epidermal cyst: Secondary | ICD-10-CM | POA: Diagnosis not present

## 2016-04-30 DIAGNOSIS — L853 Xerosis cutis: Secondary | ICD-10-CM | POA: Diagnosis not present

## 2016-05-03 ENCOUNTER — Telehealth: Payer: Self-pay | Admitting: Internal Medicine

## 2016-05-03 NOTE — Telephone Encounter (Signed)
Left pt a vm to call office to sch flu shot. °

## 2016-06-18 DIAGNOSIS — Z23 Encounter for immunization: Secondary | ICD-10-CM | POA: Diagnosis not present

## 2016-07-02 DIAGNOSIS — N63 Unspecified lump in unspecified breast: Secondary | ICD-10-CM | POA: Diagnosis not present

## 2016-10-26 DIAGNOSIS — Z79899 Other long term (current) drug therapy: Secondary | ICD-10-CM | POA: Diagnosis not present

## 2016-10-26 DIAGNOSIS — E039 Hypothyroidism, unspecified: Secondary | ICD-10-CM | POA: Diagnosis not present

## 2016-10-26 DIAGNOSIS — R7309 Other abnormal glucose: Secondary | ICD-10-CM | POA: Diagnosis not present

## 2016-10-26 DIAGNOSIS — M79671 Pain in right foot: Secondary | ICD-10-CM | POA: Diagnosis not present

## 2016-10-26 DIAGNOSIS — F411 Generalized anxiety disorder: Secondary | ICD-10-CM | POA: Diagnosis not present

## 2016-10-26 DIAGNOSIS — I10 Essential (primary) hypertension: Secondary | ICD-10-CM | POA: Diagnosis not present

## 2016-10-26 DIAGNOSIS — R0602 Shortness of breath: Secondary | ICD-10-CM | POA: Diagnosis not present

## 2016-10-31 ENCOUNTER — Inpatient Hospital Stay
Admission: RE | Admit: 2016-10-31 | Discharge: 2016-10-31 | Disposition: A | Discharge status: Home / Self Care | Payer: Self-pay | Source: Ambulatory Visit | Attending: *Deleted | Admitting: *Deleted

## 2016-10-31 ENCOUNTER — Other Ambulatory Visit: Payer: Self-pay | Admitting: Surgery

## 2016-10-31 ENCOUNTER — Other Ambulatory Visit: Payer: Self-pay | Admitting: *Deleted

## 2016-10-31 DIAGNOSIS — Z1231 Encounter for screening mammogram for malignant neoplasm of breast: Secondary | ICD-10-CM

## 2016-10-31 DIAGNOSIS — N63 Unspecified lump in unspecified breast: Secondary | ICD-10-CM

## 2016-11-05 DIAGNOSIS — R8290 Unspecified abnormal findings in urine: Secondary | ICD-10-CM | POA: Diagnosis not present

## 2016-11-13 DIAGNOSIS — J189 Pneumonia, unspecified organism: Secondary | ICD-10-CM | POA: Diagnosis not present

## 2016-11-14 ENCOUNTER — Ambulatory Visit
Admission: RE | Admit: 2016-11-14 | Discharge: 2016-11-14 | Disposition: A | Discharge status: Home / Self Care | Payer: Medicare HMO | Source: Ambulatory Visit | Attending: Surgery | Admitting: Surgery

## 2016-11-14 DIAGNOSIS — D242 Benign neoplasm of left breast: Secondary | ICD-10-CM | POA: Diagnosis not present

## 2016-11-14 DIAGNOSIS — N63 Unspecified lump in unspecified breast: Secondary | ICD-10-CM

## 2016-11-14 DIAGNOSIS — N6002 Solitary cyst of left breast: Secondary | ICD-10-CM | POA: Diagnosis not present

## 2016-11-14 DIAGNOSIS — N6320 Unspecified lump in the left breast, unspecified quadrant: Secondary | ICD-10-CM | POA: Diagnosis present

## 2016-11-19 ENCOUNTER — Ambulatory Visit: Payer: Commercial Managed Care - HMO | Admitting: Podiatry

## 2016-11-21 DIAGNOSIS — D242 Benign neoplasm of left breast: Secondary | ICD-10-CM | POA: Diagnosis not present

## 2016-12-03 DIAGNOSIS — H524 Presbyopia: Secondary | ICD-10-CM | POA: Diagnosis not present

## 2016-12-10 ENCOUNTER — Encounter: Payer: Self-pay | Admitting: Podiatry

## 2016-12-10 ENCOUNTER — Ambulatory Visit: Payer: Medicare HMO

## 2016-12-10 ENCOUNTER — Ambulatory Visit (INDEPENDENT_AMBULATORY_CARE_PROVIDER_SITE_OTHER): Payer: Medicare HMO | Admitting: Podiatry

## 2016-12-10 DIAGNOSIS — M79676 Pain in unspecified toe(s): Secondary | ICD-10-CM | POA: Diagnosis not present

## 2016-12-10 DIAGNOSIS — Q828 Other specified congenital malformations of skin: Secondary | ICD-10-CM

## 2016-12-10 DIAGNOSIS — B351 Tinea unguium: Secondary | ICD-10-CM

## 2016-12-10 DIAGNOSIS — M79671 Pain in right foot: Secondary | ICD-10-CM

## 2016-12-10 NOTE — Progress Notes (Signed)
She presents today concerned about painful elongated toenails and callus between the first and second toes of the right foot.  Objective: Vital signs are stable she is alert and oriented 3 pulses are palpable. She has thick yellow dystrophic with mycotic nails bilaterally. Hammertoe deformities with hallux valgus deformity resulting in a reactive hyperkeratosis between the first toe and second toe at the level of the PIPJ.  Assessment: Patient has a noted onychomycosis for keratoses capsulitis.  Plan: Debrided all reactive hyperkeratosis. Really painful mycotic nails bilateral. Follow up with me on an as-needed basis.

## 2016-12-12 ENCOUNTER — Ambulatory Visit: Payer: Commercial Managed Care - HMO | Admitting: Podiatry

## 2017-01-28 DIAGNOSIS — M545 Low back pain: Secondary | ICD-10-CM | POA: Diagnosis not present

## 2017-02-01 DIAGNOSIS — E039 Hypothyroidism, unspecified: Secondary | ICD-10-CM | POA: Diagnosis not present

## 2017-02-01 DIAGNOSIS — I480 Paroxysmal atrial fibrillation: Secondary | ICD-10-CM | POA: Diagnosis not present

## 2017-02-01 DIAGNOSIS — R7309 Other abnormal glucose: Secondary | ICD-10-CM | POA: Diagnosis not present

## 2017-02-01 DIAGNOSIS — Z79899 Other long term (current) drug therapy: Secondary | ICD-10-CM | POA: Diagnosis not present

## 2017-02-01 DIAGNOSIS — I10 Essential (primary) hypertension: Secondary | ICD-10-CM | POA: Diagnosis not present

## 2017-02-01 DIAGNOSIS — F411 Generalized anxiety disorder: Secondary | ICD-10-CM | POA: Diagnosis not present

## 2017-02-05 DIAGNOSIS — R2689 Other abnormalities of gait and mobility: Secondary | ICD-10-CM | POA: Diagnosis not present

## 2017-02-05 DIAGNOSIS — I10 Essential (primary) hypertension: Secondary | ICD-10-CM | POA: Diagnosis not present

## 2017-02-05 DIAGNOSIS — M6281 Muscle weakness (generalized): Secondary | ICD-10-CM | POA: Diagnosis not present

## 2017-02-05 DIAGNOSIS — R7309 Other abnormal glucose: Secondary | ICD-10-CM | POA: Diagnosis not present

## 2017-02-05 DIAGNOSIS — I480 Paroxysmal atrial fibrillation: Secondary | ICD-10-CM | POA: Diagnosis not present

## 2017-02-05 DIAGNOSIS — F411 Generalized anxiety disorder: Secondary | ICD-10-CM | POA: Diagnosis not present

## 2017-02-05 DIAGNOSIS — E039 Hypothyroidism, unspecified: Secondary | ICD-10-CM | POA: Diagnosis not present

## 2017-02-06 DIAGNOSIS — F411 Generalized anxiety disorder: Secondary | ICD-10-CM | POA: Diagnosis not present

## 2017-02-06 DIAGNOSIS — I10 Essential (primary) hypertension: Secondary | ICD-10-CM | POA: Diagnosis not present

## 2017-02-06 DIAGNOSIS — I480 Paroxysmal atrial fibrillation: Secondary | ICD-10-CM | POA: Diagnosis not present

## 2017-02-06 DIAGNOSIS — M6281 Muscle weakness (generalized): Secondary | ICD-10-CM | POA: Diagnosis not present

## 2017-02-06 DIAGNOSIS — E039 Hypothyroidism, unspecified: Secondary | ICD-10-CM | POA: Diagnosis not present

## 2017-02-06 DIAGNOSIS — R7309 Other abnormal glucose: Secondary | ICD-10-CM | POA: Diagnosis not present

## 2017-02-06 DIAGNOSIS — R2689 Other abnormalities of gait and mobility: Secondary | ICD-10-CM | POA: Diagnosis not present

## 2017-02-07 DIAGNOSIS — R0602 Shortness of breath: Secondary | ICD-10-CM | POA: Diagnosis not present

## 2017-02-07 DIAGNOSIS — R600 Localized edema: Secondary | ICD-10-CM | POA: Diagnosis not present

## 2017-02-07 DIAGNOSIS — I441 Atrioventricular block, second degree: Secondary | ICD-10-CM | POA: Diagnosis not present

## 2017-02-07 DIAGNOSIS — I10 Essential (primary) hypertension: Secondary | ICD-10-CM | POA: Diagnosis not present

## 2017-02-07 DIAGNOSIS — I480 Paroxysmal atrial fibrillation: Secondary | ICD-10-CM | POA: Diagnosis not present

## 2017-02-11 DIAGNOSIS — R2689 Other abnormalities of gait and mobility: Secondary | ICD-10-CM | POA: Diagnosis not present

## 2017-02-11 DIAGNOSIS — M6281 Muscle weakness (generalized): Secondary | ICD-10-CM | POA: Diagnosis not present

## 2017-02-11 DIAGNOSIS — Z9181 History of falling: Secondary | ICD-10-CM | POA: Diagnosis not present

## 2017-02-11 DIAGNOSIS — I480 Paroxysmal atrial fibrillation: Secondary | ICD-10-CM | POA: Diagnosis not present

## 2017-02-11 DIAGNOSIS — M545 Low back pain: Secondary | ICD-10-CM | POA: Diagnosis not present

## 2017-02-11 DIAGNOSIS — F411 Generalized anxiety disorder: Secondary | ICD-10-CM | POA: Diagnosis not present

## 2017-02-11 DIAGNOSIS — I10 Essential (primary) hypertension: Secondary | ICD-10-CM | POA: Diagnosis not present

## 2017-02-12 DIAGNOSIS — M6281 Muscle weakness (generalized): Secondary | ICD-10-CM | POA: Diagnosis not present

## 2017-02-12 DIAGNOSIS — I10 Essential (primary) hypertension: Secondary | ICD-10-CM | POA: Diagnosis not present

## 2017-02-12 DIAGNOSIS — R2689 Other abnormalities of gait and mobility: Secondary | ICD-10-CM | POA: Diagnosis not present

## 2017-02-12 DIAGNOSIS — Z9181 History of falling: Secondary | ICD-10-CM | POA: Diagnosis not present

## 2017-02-12 DIAGNOSIS — F411 Generalized anxiety disorder: Secondary | ICD-10-CM | POA: Diagnosis not present

## 2017-02-12 DIAGNOSIS — I480 Paroxysmal atrial fibrillation: Secondary | ICD-10-CM | POA: Diagnosis not present

## 2017-02-12 DIAGNOSIS — M545 Low back pain: Secondary | ICD-10-CM | POA: Diagnosis not present

## 2017-02-14 DIAGNOSIS — R2689 Other abnormalities of gait and mobility: Secondary | ICD-10-CM | POA: Diagnosis not present

## 2017-02-14 DIAGNOSIS — I480 Paroxysmal atrial fibrillation: Secondary | ICD-10-CM | POA: Diagnosis not present

## 2017-02-14 DIAGNOSIS — M545 Low back pain: Secondary | ICD-10-CM | POA: Diagnosis not present

## 2017-02-14 DIAGNOSIS — Z9181 History of falling: Secondary | ICD-10-CM | POA: Diagnosis not present

## 2017-02-14 DIAGNOSIS — I10 Essential (primary) hypertension: Secondary | ICD-10-CM | POA: Diagnosis not present

## 2017-02-14 DIAGNOSIS — M6281 Muscle weakness (generalized): Secondary | ICD-10-CM | POA: Diagnosis not present

## 2017-02-14 DIAGNOSIS — F411 Generalized anxiety disorder: Secondary | ICD-10-CM | POA: Diagnosis not present

## 2017-02-18 DIAGNOSIS — I10 Essential (primary) hypertension: Secondary | ICD-10-CM | POA: Diagnosis not present

## 2017-02-18 DIAGNOSIS — M6281 Muscle weakness (generalized): Secondary | ICD-10-CM | POA: Diagnosis not present

## 2017-02-18 DIAGNOSIS — R2689 Other abnormalities of gait and mobility: Secondary | ICD-10-CM | POA: Diagnosis not present

## 2017-02-18 DIAGNOSIS — I480 Paroxysmal atrial fibrillation: Secondary | ICD-10-CM | POA: Diagnosis not present

## 2017-02-18 DIAGNOSIS — F411 Generalized anxiety disorder: Secondary | ICD-10-CM | POA: Diagnosis not present

## 2017-02-18 DIAGNOSIS — Z9181 History of falling: Secondary | ICD-10-CM | POA: Diagnosis not present

## 2017-02-18 DIAGNOSIS — M545 Low back pain: Secondary | ICD-10-CM | POA: Diagnosis not present

## 2017-02-20 DIAGNOSIS — M545 Low back pain: Secondary | ICD-10-CM | POA: Diagnosis not present

## 2017-02-20 DIAGNOSIS — M6281 Muscle weakness (generalized): Secondary | ICD-10-CM | POA: Diagnosis not present

## 2017-02-20 DIAGNOSIS — Z9181 History of falling: Secondary | ICD-10-CM | POA: Diagnosis not present

## 2017-02-20 DIAGNOSIS — I480 Paroxysmal atrial fibrillation: Secondary | ICD-10-CM | POA: Diagnosis not present

## 2017-02-20 DIAGNOSIS — R2689 Other abnormalities of gait and mobility: Secondary | ICD-10-CM | POA: Diagnosis not present

## 2017-02-20 DIAGNOSIS — I10 Essential (primary) hypertension: Secondary | ICD-10-CM | POA: Diagnosis not present

## 2017-02-20 DIAGNOSIS — F411 Generalized anxiety disorder: Secondary | ICD-10-CM | POA: Diagnosis not present

## 2017-02-25 DIAGNOSIS — Z9181 History of falling: Secondary | ICD-10-CM | POA: Diagnosis not present

## 2017-02-25 DIAGNOSIS — I10 Essential (primary) hypertension: Secondary | ICD-10-CM | POA: Diagnosis not present

## 2017-02-25 DIAGNOSIS — I480 Paroxysmal atrial fibrillation: Secondary | ICD-10-CM | POA: Diagnosis not present

## 2017-02-25 DIAGNOSIS — F411 Generalized anxiety disorder: Secondary | ICD-10-CM | POA: Diagnosis not present

## 2017-02-25 DIAGNOSIS — M545 Low back pain: Secondary | ICD-10-CM | POA: Diagnosis not present

## 2017-02-25 DIAGNOSIS — R2689 Other abnormalities of gait and mobility: Secondary | ICD-10-CM | POA: Diagnosis not present

## 2017-02-25 DIAGNOSIS — M6281 Muscle weakness (generalized): Secondary | ICD-10-CM | POA: Diagnosis not present

## 2017-02-27 DIAGNOSIS — I480 Paroxysmal atrial fibrillation: Secondary | ICD-10-CM | POA: Diagnosis not present

## 2017-02-27 DIAGNOSIS — I10 Essential (primary) hypertension: Secondary | ICD-10-CM | POA: Diagnosis not present

## 2017-02-27 DIAGNOSIS — F411 Generalized anxiety disorder: Secondary | ICD-10-CM | POA: Diagnosis not present

## 2017-02-27 DIAGNOSIS — M545 Low back pain: Secondary | ICD-10-CM | POA: Diagnosis not present

## 2017-02-28 DIAGNOSIS — F411 Generalized anxiety disorder: Secondary | ICD-10-CM | POA: Diagnosis not present

## 2017-02-28 DIAGNOSIS — Z9181 History of falling: Secondary | ICD-10-CM | POA: Diagnosis not present

## 2017-02-28 DIAGNOSIS — M6281 Muscle weakness (generalized): Secondary | ICD-10-CM | POA: Diagnosis not present

## 2017-02-28 DIAGNOSIS — R2689 Other abnormalities of gait and mobility: Secondary | ICD-10-CM | POA: Diagnosis not present

## 2017-02-28 DIAGNOSIS — I480 Paroxysmal atrial fibrillation: Secondary | ICD-10-CM | POA: Diagnosis not present

## 2017-02-28 DIAGNOSIS — M545 Low back pain: Secondary | ICD-10-CM | POA: Diagnosis not present

## 2017-02-28 DIAGNOSIS — I10 Essential (primary) hypertension: Secondary | ICD-10-CM | POA: Diagnosis not present

## 2017-03-04 DIAGNOSIS — I480 Paroxysmal atrial fibrillation: Secondary | ICD-10-CM | POA: Diagnosis not present

## 2017-03-04 DIAGNOSIS — R0602 Shortness of breath: Secondary | ICD-10-CM | POA: Diagnosis not present

## 2017-03-04 DIAGNOSIS — Z79899 Other long term (current) drug therapy: Secondary | ICD-10-CM | POA: Diagnosis not present

## 2017-03-04 DIAGNOSIS — I441 Atrioventricular block, second degree: Secondary | ICD-10-CM | POA: Diagnosis not present

## 2017-03-04 DIAGNOSIS — I10 Essential (primary) hypertension: Secondary | ICD-10-CM | POA: Diagnosis not present

## 2017-03-04 DIAGNOSIS — R600 Localized edema: Secondary | ICD-10-CM | POA: Diagnosis not present

## 2017-03-05 DIAGNOSIS — F411 Generalized anxiety disorder: Secondary | ICD-10-CM | POA: Diagnosis not present

## 2017-03-05 DIAGNOSIS — I10 Essential (primary) hypertension: Secondary | ICD-10-CM | POA: Diagnosis not present

## 2017-03-05 DIAGNOSIS — M6281 Muscle weakness (generalized): Secondary | ICD-10-CM | POA: Diagnosis not present

## 2017-03-05 DIAGNOSIS — I480 Paroxysmal atrial fibrillation: Secondary | ICD-10-CM | POA: Diagnosis not present

## 2017-03-05 DIAGNOSIS — M545 Low back pain: Secondary | ICD-10-CM | POA: Diagnosis not present

## 2017-03-05 DIAGNOSIS — R2689 Other abnormalities of gait and mobility: Secondary | ICD-10-CM | POA: Diagnosis not present

## 2017-03-05 DIAGNOSIS — Z9181 History of falling: Secondary | ICD-10-CM | POA: Diagnosis not present

## 2017-03-07 DIAGNOSIS — I10 Essential (primary) hypertension: Secondary | ICD-10-CM | POA: Diagnosis not present

## 2017-03-07 DIAGNOSIS — I480 Paroxysmal atrial fibrillation: Secondary | ICD-10-CM | POA: Diagnosis not present

## 2017-03-07 DIAGNOSIS — R2689 Other abnormalities of gait and mobility: Secondary | ICD-10-CM | POA: Diagnosis not present

## 2017-03-07 DIAGNOSIS — M545 Low back pain: Secondary | ICD-10-CM | POA: Diagnosis not present

## 2017-03-07 DIAGNOSIS — M6281 Muscle weakness (generalized): Secondary | ICD-10-CM | POA: Diagnosis not present

## 2017-03-07 DIAGNOSIS — Z9181 History of falling: Secondary | ICD-10-CM | POA: Diagnosis not present

## 2017-03-07 DIAGNOSIS — F411 Generalized anxiety disorder: Secondary | ICD-10-CM | POA: Diagnosis not present

## 2017-03-16 DIAGNOSIS — R2689 Other abnormalities of gait and mobility: Secondary | ICD-10-CM | POA: Diagnosis not present

## 2017-03-16 DIAGNOSIS — I480 Paroxysmal atrial fibrillation: Secondary | ICD-10-CM | POA: Diagnosis not present

## 2017-03-16 DIAGNOSIS — Z9181 History of falling: Secondary | ICD-10-CM | POA: Diagnosis not present

## 2017-03-16 DIAGNOSIS — M545 Low back pain: Secondary | ICD-10-CM | POA: Diagnosis not present

## 2017-03-16 DIAGNOSIS — I10 Essential (primary) hypertension: Secondary | ICD-10-CM | POA: Diagnosis not present

## 2017-03-16 DIAGNOSIS — M6281 Muscle weakness (generalized): Secondary | ICD-10-CM | POA: Diagnosis not present

## 2017-03-16 DIAGNOSIS — F411 Generalized anxiety disorder: Secondary | ICD-10-CM | POA: Diagnosis not present

## 2017-03-20 DIAGNOSIS — I480 Paroxysmal atrial fibrillation: Secondary | ICD-10-CM | POA: Diagnosis not present

## 2017-03-20 DIAGNOSIS — M6281 Muscle weakness (generalized): Secondary | ICD-10-CM | POA: Diagnosis not present

## 2017-03-20 DIAGNOSIS — F411 Generalized anxiety disorder: Secondary | ICD-10-CM | POA: Diagnosis not present

## 2017-03-20 DIAGNOSIS — I10 Essential (primary) hypertension: Secondary | ICD-10-CM | POA: Diagnosis not present

## 2017-03-20 DIAGNOSIS — Z9181 History of falling: Secondary | ICD-10-CM | POA: Diagnosis not present

## 2017-03-20 DIAGNOSIS — R2689 Other abnormalities of gait and mobility: Secondary | ICD-10-CM | POA: Diagnosis not present

## 2017-03-20 DIAGNOSIS — M545 Low back pain: Secondary | ICD-10-CM | POA: Diagnosis not present

## 2017-05-06 DIAGNOSIS — R7309 Other abnormal glucose: Secondary | ICD-10-CM | POA: Diagnosis not present

## 2017-05-06 DIAGNOSIS — F411 Generalized anxiety disorder: Secondary | ICD-10-CM | POA: Diagnosis not present

## 2017-05-06 DIAGNOSIS — Z79899 Other long term (current) drug therapy: Secondary | ICD-10-CM | POA: Diagnosis not present

## 2017-05-06 DIAGNOSIS — R0602 Shortness of breath: Secondary | ICD-10-CM | POA: Diagnosis not present

## 2017-05-06 DIAGNOSIS — Z0000 Encounter for general adult medical examination without abnormal findings: Secondary | ICD-10-CM | POA: Diagnosis not present

## 2017-05-06 DIAGNOSIS — E039 Hypothyroidism, unspecified: Secondary | ICD-10-CM | POA: Diagnosis not present

## 2017-05-06 DIAGNOSIS — I480 Paroxysmal atrial fibrillation: Secondary | ICD-10-CM | POA: Diagnosis not present

## 2017-05-06 DIAGNOSIS — R8290 Unspecified abnormal findings in urine: Secondary | ICD-10-CM | POA: Diagnosis not present

## 2017-05-06 DIAGNOSIS — I10 Essential (primary) hypertension: Secondary | ICD-10-CM | POA: Diagnosis not present

## 2017-05-08 DIAGNOSIS — Z23 Encounter for immunization: Secondary | ICD-10-CM | POA: Diagnosis not present

## 2017-05-20 DIAGNOSIS — H903 Sensorineural hearing loss, bilateral: Secondary | ICD-10-CM | POA: Diagnosis not present

## 2017-05-20 DIAGNOSIS — H6123 Impacted cerumen, bilateral: Secondary | ICD-10-CM | POA: Diagnosis not present

## 2017-06-03 DIAGNOSIS — I10 Essential (primary) hypertension: Secondary | ICD-10-CM | POA: Diagnosis not present

## 2017-06-03 DIAGNOSIS — I480 Paroxysmal atrial fibrillation: Secondary | ICD-10-CM | POA: Diagnosis not present

## 2017-06-03 DIAGNOSIS — R600 Localized edema: Secondary | ICD-10-CM | POA: Diagnosis not present

## 2017-06-03 DIAGNOSIS — I441 Atrioventricular block, second degree: Secondary | ICD-10-CM | POA: Diagnosis not present

## 2017-06-03 DIAGNOSIS — R0602 Shortness of breath: Secondary | ICD-10-CM | POA: Diagnosis not present

## 2017-08-09 DIAGNOSIS — I480 Paroxysmal atrial fibrillation: Secondary | ICD-10-CM | POA: Diagnosis not present

## 2017-08-09 DIAGNOSIS — R0602 Shortness of breath: Secondary | ICD-10-CM | POA: Diagnosis not present

## 2017-08-09 DIAGNOSIS — Z79899 Other long term (current) drug therapy: Secondary | ICD-10-CM | POA: Diagnosis not present

## 2017-08-09 DIAGNOSIS — I10 Essential (primary) hypertension: Secondary | ICD-10-CM | POA: Diagnosis not present

## 2017-08-09 DIAGNOSIS — E039 Hypothyroidism, unspecified: Secondary | ICD-10-CM | POA: Diagnosis not present

## 2017-08-09 DIAGNOSIS — R7309 Other abnormal glucose: Secondary | ICD-10-CM | POA: Diagnosis not present

## 2017-08-09 DIAGNOSIS — F411 Generalized anxiety disorder: Secondary | ICD-10-CM | POA: Diagnosis not present

## 2017-10-14 DIAGNOSIS — H25013 Cortical age-related cataract, bilateral: Secondary | ICD-10-CM | POA: Diagnosis not present

## 2017-10-14 DIAGNOSIS — H2513 Age-related nuclear cataract, bilateral: Secondary | ICD-10-CM | POA: Diagnosis not present

## 2017-10-14 DIAGNOSIS — H524 Presbyopia: Secondary | ICD-10-CM | POA: Diagnosis not present

## 2017-10-15 DIAGNOSIS — R0602 Shortness of breath: Secondary | ICD-10-CM | POA: Diagnosis not present

## 2017-10-15 DIAGNOSIS — R600 Localized edema: Secondary | ICD-10-CM | POA: Diagnosis not present

## 2017-10-15 DIAGNOSIS — I480 Paroxysmal atrial fibrillation: Secondary | ICD-10-CM | POA: Diagnosis not present

## 2017-10-15 DIAGNOSIS — I10 Essential (primary) hypertension: Secondary | ICD-10-CM | POA: Diagnosis not present

## 2017-10-15 DIAGNOSIS — I441 Atrioventricular block, second degree: Secondary | ICD-10-CM | POA: Diagnosis not present

## 2017-10-28 DIAGNOSIS — I10 Essential (primary) hypertension: Secondary | ICD-10-CM | POA: Diagnosis not present

## 2017-10-28 DIAGNOSIS — I480 Paroxysmal atrial fibrillation: Secondary | ICD-10-CM | POA: Diagnosis not present

## 2017-10-28 DIAGNOSIS — J069 Acute upper respiratory infection, unspecified: Secondary | ICD-10-CM | POA: Diagnosis not present

## 2017-11-11 DIAGNOSIS — J0110 Acute frontal sinusitis, unspecified: Secondary | ICD-10-CM | POA: Diagnosis not present

## 2018-02-26 DIAGNOSIS — R0602 Shortness of breath: Secondary | ICD-10-CM | POA: Diagnosis not present

## 2018-02-26 DIAGNOSIS — I10 Essential (primary) hypertension: Secondary | ICD-10-CM | POA: Diagnosis not present

## 2018-02-26 DIAGNOSIS — R600 Localized edema: Secondary | ICD-10-CM | POA: Diagnosis not present

## 2018-02-26 DIAGNOSIS — I441 Atrioventricular block, second degree: Secondary | ICD-10-CM | POA: Diagnosis not present

## 2018-02-26 DIAGNOSIS — I480 Paroxysmal atrial fibrillation: Secondary | ICD-10-CM | POA: Diagnosis not present

## 2018-04-03 ENCOUNTER — Emergency Department: Payer: Medicare HMO

## 2018-04-03 ENCOUNTER — Emergency Department
Admission: EM | Admit: 2018-04-03 | Discharge: 2018-04-03 | Disposition: A | Discharge status: Home / Self Care | Payer: Medicare HMO | Attending: Emergency Medicine | Admitting: Emergency Medicine

## 2018-04-03 ENCOUNTER — Other Ambulatory Visit: Payer: Self-pay

## 2018-04-03 DIAGNOSIS — W01198A Fall on same level from slipping, tripping and stumbling with subsequent striking against other object, initial encounter: Secondary | ICD-10-CM | POA: Insufficient documentation

## 2018-04-03 DIAGNOSIS — Z7982 Long term (current) use of aspirin: Secondary | ICD-10-CM | POA: Diagnosis not present

## 2018-04-03 DIAGNOSIS — S42032A Displaced fracture of lateral end of left clavicle, initial encounter for closed fracture: Secondary | ICD-10-CM | POA: Diagnosis not present

## 2018-04-03 DIAGNOSIS — I10 Essential (primary) hypertension: Secondary | ICD-10-CM | POA: Insufficient documentation

## 2018-04-03 DIAGNOSIS — I700 Atherosclerosis of aorta: Secondary | ICD-10-CM

## 2018-04-03 DIAGNOSIS — S42125A Nondisplaced fracture of acromial process, left shoulder, initial encounter for closed fracture: Secondary | ICD-10-CM | POA: Insufficient documentation

## 2018-04-03 DIAGNOSIS — I480 Paroxysmal atrial fibrillation: Secondary | ICD-10-CM | POA: Insufficient documentation

## 2018-04-03 DIAGNOSIS — E039 Hypothyroidism, unspecified: Secondary | ICD-10-CM | POA: Diagnosis not present

## 2018-04-03 DIAGNOSIS — Y999 Unspecified external cause status: Secondary | ICD-10-CM | POA: Insufficient documentation

## 2018-04-03 DIAGNOSIS — Y9289 Other specified places as the place of occurrence of the external cause: Secondary | ICD-10-CM | POA: Insufficient documentation

## 2018-04-03 DIAGNOSIS — W19XXXA Unspecified fall, initial encounter: Secondary | ICD-10-CM | POA: Diagnosis not present

## 2018-04-03 DIAGNOSIS — Y9389 Activity, other specified: Secondary | ICD-10-CM | POA: Insufficient documentation

## 2018-04-03 DIAGNOSIS — M6281 Muscle weakness (generalized): Secondary | ICD-10-CM | POA: Diagnosis not present

## 2018-04-03 DIAGNOSIS — S4992XA Unspecified injury of left shoulder and upper arm, initial encounter: Secondary | ICD-10-CM | POA: Diagnosis present

## 2018-04-03 DIAGNOSIS — Z79899 Other long term (current) drug therapy: Secondary | ICD-10-CM | POA: Diagnosis not present

## 2018-04-03 MED ORDER — LIDOCAINE 5 % EX PTCH: 1.0000 | MEDICATED_PATCH | CUTANEOUS | 0 refills | Status: AC

## 2018-04-03 MED ORDER — LIDOCAINE 5 % EX PTCH
1.0000 | MEDICATED_PATCH | CUTANEOUS | Status: DC
  Administered 2018-04-03: 1 via TRANSDERMAL
  Filled 2018-04-03: qty 1

## 2018-04-03 MED ORDER — DICLOFENAC SODIUM 1 % TD GEL: 2.0000 g | Freq: Four times a day (QID) | TRANSDERMAL | 0 refills | Status: AC

## 2018-04-03 NOTE — ED Provider Notes (Signed)
[facility] Emergency Department Provider Note  ____________________________________________  Time seen: Approximately 5:52 PM  I have reviewed the triage vital signs and the nursing notes.   HISTORY  Chief Complaint Fall and Shoulder Pain    HPI Destiny Burns is a 84 y.o. female that presents to emergency department for evaluation of left shoulder pain after falling today.  Patient was getting her hair done at the beautician.  She was in the bathroom and was trying to shut the door when she lost her footing.  She tried to grab onto the door but proceeded to fall.  She hit her shoulder on the commode.  She landed on her buttocks.  She did not hit her head or lose consciousness.  She denies any additional pain.  She has been able to walk and move both of her legs without difficulty.  She is able to move her arm normally but has pain over her shoulder.  She lives alone but has her son and 2 siblings close by.   Past Medical History:  Diagnosis Date  . Abnormal glucose   . Anemia    as a child  . Arthritis   . Atrial fibrillation ([place])   . Cancer ([place])    basal cell  . Dupuytren contracture   . GAD (generalized anxiety disorder)   . GERD (gastroesophageal reflux disease)    no meds  . Hypertension   . Hypothyroidism   . Mobitz type 2 second degree atrioventricular block   . Pedal edema   . SOB (shortness of breath) on exertion   . Thyroid disease     Patient Active Problem List   Diagnosis Date Noted  . Vaginal atrophy 01/12/2016  . Incontinence 01/12/2016  . Atrioventricular block, Mobitz type 2 10/06/2015  . Paroxysmal atrial fibrillation ([place]) 09/22/2015  . Edema of foot 09/22/2015  . Breathlessness on exertion 09/22/2015  . Abnormal glucose level 09/16/2015  . Acquired hypothyroidism 09/16/2015  . Basal cell carcinoma 09/16/2015  . Anxiety, generalized 09/16/2015  . Generalized OA 09/16/2015  . Gastro-esophageal reflux disease without  esophagitis 09/16/2015  . BP (high blood pressure) 09/16/2015  . Acute cystitis without hematuria 08/19/2015  . Compression fracture of L3 lumbar vertebra 05/10/2015  . CN (constipation) 05/10/2015  . Low back pain 04/28/2015  . Irregular heart rate 04/28/2015  . Actinic keratoses 12/14/2014  . H/O neoplasm 12/14/2014  . Abnormality of gait 10/03/2014  . Unspecified hypothyroidism 08/25/2013  . Seborrheic keratoses, inflamed 08/25/2013  . Hypertension 06/01/2011  . Hyperlipidemia 06/01/2011    Past Surgical History:  Procedure Laterality Date  . ABDOMINAL HYSTERECTOMY    . BREAST BIOPSY Left 11/08/15   neg  . BREAST BIOPSY Right    neg  . DILATION AND CURETTAGE OF UTERUS    . HAND SURGERY    . KYPHOPLASTY N/A 05/31/2015   Procedure: KYPHOPLASTY L3;  Surgeon: Hessie Knows, MD;  Location: [facility] ORS;  Service: Orthopedics;  Laterality: N/A;  . TONSILLECTOMY      Prior to Admission medications   Medication Sig Start Date End Date Taking? Authorizing Provider  acetaminophen (TYLENOL) 325 MG tablet Take 650 mg by mouth every 6 (six) hours as needed. Reported on 01/24/2016    [provider]  aspirin 81 MG tablet Take 81 mg by mouth daily.      [provider]  calcium carbonate (CALCIUM 600) 600 MG TABS tablet Take 600 mg by mouth 2 (two) times daily with a meal. Reported  on 01/10/2016    [provider]  cholecalciferol (VITAMIN D) 1000 UNITS tablet Take 1,000 Units by mouth daily.      [provider]  conjugated estrogens (PREMARIN) vaginal cream Place 1 Applicatorful vaginally daily. Apply 0.5mg  (pea-sized amount)  just inside the vaginal introitus with a finger-tip every night for two weeks and then Monday, Wednesday and Friday nights. Patient not taking: Reported on 01/24/2016 01/10/16   Zara Council A, PA-C  diclofenac sodium (VOLTAREN) 1 % GEL Apply 2 g topically 4 (four) times daily. 04/03/18   Laban Emperor, PA-C  estradiol (ESTRACE  VAGINAL) 0.1 MG/GM vaginal cream Apply 0.5mg  (pea-sized amount)  just inside the vaginal introitus with a finger-tip every night for two weeks and then Monday, Wednesday and Friday nights. Patient not taking: Reported on 01/24/2016 01/10/16   Zara Council A, PA-C  hydrochlorothiazide (HYDRODIURIL) 25 MG tablet Take 1 tablet (25 mg total) by mouth daily. 07/21/15   Jackolyn Confer, MD  levothyroxine (SYNTHROID, LEVOTHROID) 25 MCG tablet Take 1 tablet (25 mcg total) by mouth daily. 07/21/15   Jackolyn Confer, MD  lidocaine (LIDODERM) 5 % Place 1 patch onto the skin daily. Remove & Discard patch within 12 hours or as directed by MD 04/03/18   Laban Emperor, PA-C  loratadine (CLARITIN) 10 MG tablet Take 10 mg by mouth.    [provider]  mirabegron ER (MYRBETRIQ) 25 MG TB24 tablet Take 25 mg by mouth daily. Reported on 01/24/2016    [provider]    Allergies Amoxicillin and Sulfa antibiotics  Family History  Problem Relation Age of Onset  . Dupuytren's contracture Mother   . Breast cancer Mother 44  . Dupuytren's contracture Brother   . Hypertension Brother   . Breast cancer Sister 35  . Breast cancer Maternal Aunt 76    Social History Social History   Tobacco Use  . Smoking status: Never Smoker  . Smokeless tobacco: Never Used  Substance Use Topics  . Alcohol use: No  . Drug use: No     Review of Systems  Cardiovascular: No chest pain. Respiratory: No SOB. Gastrointestinal: No abdominal pain.  No nausea, no vomiting.  Musculoskeletal: Positive for shoulder pain.  Skin: Negative for rash, abrasions, lacerations. Neurological: Negative for headaches   ____________________________________________   PHYSICAL EXAM:  VITAL SIGNS: ED Triage Vitals  Enc Vitals Group     BP 04/03/18 1620 (!) 151/84     Pulse Rate 04/03/18 1620 86     Resp 04/03/18 1620 14     Temp 04/03/18 1620 98.6 F (37 C)     Temp Source 04/03/18 1620 Oral     SpO2 04/03/18  1620 97 %     Weight 04/03/18 1618 150 lb (68 kg)     Height 04/03/18 1618 5\' 3"  (1.6 m)     Head Circumference --      Peak Flow --      Pain Score 04/03/18 1618 9     Pain Loc --      Pain Edu? --      Excl. in [place]? --      Constitutional: Alert and oriented. Well appearing and in no acute distress. Eyes: Conjunctivae are normal. PERRL. EOMI. Head: Atraumatic. ENT:      Ears:      Nose: No congestion/rhinnorhea.      Mouth/Throat: Mucous membranes are moist.  Neck: No stridor.  No cervical spine tenderness to palpation. Cardiovascular: Normal rate, regular  rhythm.  Good peripheral circulation. Respiratory: Normal respiratory effort without tachypnea or retractions. Lungs CTAB. Good air entry to the bases with no decreased or absent breath sounds. Gastrointestinal: Bowel sounds 4 quadrants. Soft and nontender to palpation. No guarding or rigidity. No palpable masses. No distention.  Musculoskeletal: Full range of motion to all extremities. No gross deformities appreciated.  Quarter sized area of ecchymosis to posterior left shoulder.  Pain with range of motion of left shoulder. Neurologic:  Normal speech and language. No gross focal neurologic deficits are appreciated.  Skin:  Skin is warm, dry and intact. No rash noted. Psychiatric: Mood and affect are normal. Speech and behavior are normal. Patient exhibits appropriate insight and judgement.   ____________________________________________   LABS (all labs ordered are listed, but only abnormal results are displayed)  Labs Reviewed - No data to display ____________________________________________  EKG   ____________________________________________  RADIOLOGY Robinette Haines, personally viewed and evaluated these images (plain radiographs) as part of my medical decision making, as well as reviewing the written report by the radiologist.  Dg Shoulder Left  Result Date: 04/03/2018 CLINICAL DATA:  Pain following fall EXAM:  LEFT SHOULDER - 2+ VIEW COMPARISON:  None. FINDINGS: Frontal, oblique, and Y scapular images were obtained. There is a fracture of the lateral aspect of the clavicle with slight displacement of fracture fragments. There is also a fracture along the lateral aspect of the acromion with alignment near anatomic in this area. No other fractures are appreciable. No dislocation. Joint spaces appear unremarkable. No erosive change. The visualized left lung is clear. There is aortic atherosclerosis. IMPRESSION: Fracture lateral clavicle with mild displacement of fracture fragments. There is also a fracture of the lateral acromion with alignment near anatomic. No other fractures are evident. No dislocation. No appreciable arthropathy. There is aortic atherosclerosis. Aortic Atherosclerosis (ICD10-I70.0). Electronically Signed   By: Lowella Grip III M.D.   On: 04/03/2018 17:30    ____________________________________________    PROCEDURES  Procedure(s) performed:    Procedures    Medications  lidocaine (LIDODERM) 5 % 1 patch (1 patch Transdermal Patch Applied 04/03/18 1841)     ____________________________________________   INITIAL IMPRESSION / ASSESSMENT AND PLAN / ED COURSE  Pertinent labs & imaging results that were available during my care of the patient were reviewed by me and considered in my medical decision making (see chart for details).  Review of the [place] CSRS was performed in accordance of the [place] prior to dispensing any controlled drugs.     Patient's diagnosis is consistent with clavicle and acromium fracture.  Vital signs and exam are reassuring.  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  X-ray consistent with clavicle and acromium  fractures.  Fractures are well aligned.  Shoulder sling was placed.  Son is in the ED with the patient.  They are agreeable to call orthopedics tomorrow.  Patient will be discharged home with prescriptions for  Voltaren gel and Lidoderm. Patient is to follow up with orthopedics as directed. Patient is given ED precautions to return to the ED for any worsening or new symptoms.     ____________________________________________  FINAL CLINICAL IMPRESSION(S) / ED DIAGNOSES  Final diagnoses:  Closed displaced fracture of acromial end of left clavicle, initial encounter  Closed nondisplaced fracture of left acromial process, initial encounter  Aortic atherosclerosis (HCC)      NEW MEDICATIONS STARTED DURING THIS VISIT:  ED Discharge Orders  Ordered    lidocaine (LIDODERM) 5 %  Every 24 hours     04/03/18 1852    diclofenac sodium (VOLTAREN) 1 % GEL  4 times daily     04/03/18 1852              This chart was dictated using voice recognition software/Dragon. Despite best efforts to proofread, errors can occur which can change the meaning. Any change was purely unintentional.    Laban Emperor, PA-C 04/03/18 2326    Arta Silence, MD 04/03/18 [id number]

## 2018-04-03 NOTE — ED Notes (Signed)
See triage note  States she fell and hit her left shoulder   States she did not hit her head  No LOC

## 2018-04-03 NOTE — ED Triage Notes (Signed)
Pt states that she lost her balance and fell while in the bathroom at the beauty shop - when pt fell she hit left shoulder on the toilet - pt is able to move the left arm with minimal difficulty

## 2018-04-03 NOTE — Discharge Instructions (Signed)
Please wear sling until seen by orthopedics. Call Dr. Marry Guan tomorrow for an appointment as soon as possible. Your xray showed some cholesterol in your arteries. Follow up with Dr. Doy Hutching to discuss this.

## 2018-04-03 NOTE — ED Notes (Signed)
First nurse note: Pt presents via [facility] with c/o fall at beauty shop. Denies LOC or hitting head. Complains of left shoulder pain. BP high for ems 171/96 with no hx of high blood pressure. Son on the way.

## 2018-04-07 DIAGNOSIS — S42035A Nondisplaced fracture of lateral end of left clavicle, initial encounter for closed fracture: Secondary | ICD-10-CM | POA: Diagnosis not present

## 2018-04-16 DIAGNOSIS — S42035D Nondisplaced fracture of lateral end of left clavicle, subsequent encounter for fracture with routine healing: Secondary | ICD-10-CM | POA: Diagnosis not present

## 2018-05-14 DIAGNOSIS — S42035D Nondisplaced fracture of lateral end of left clavicle, subsequent encounter for fracture with routine healing: Secondary | ICD-10-CM | POA: Diagnosis not present

## 2018-05-14 DIAGNOSIS — S42122D Displaced fracture of acromial process, left shoulder, subsequent encounter for fracture with routine healing: Secondary | ICD-10-CM | POA: Diagnosis not present

## 2018-05-14 DIAGNOSIS — M898X1 Other specified disorders of bone, shoulder: Secondary | ICD-10-CM | POA: Diagnosis not present

## 2018-05-16 DIAGNOSIS — Z23 Encounter for immunization: Secondary | ICD-10-CM | POA: Diagnosis not present

## 2018-05-16 DIAGNOSIS — R7309 Other abnormal glucose: Secondary | ICD-10-CM | POA: Diagnosis not present

## 2018-05-16 DIAGNOSIS — E039 Hypothyroidism, unspecified: Secondary | ICD-10-CM | POA: Diagnosis not present

## 2018-05-16 DIAGNOSIS — F411 Generalized anxiety disorder: Secondary | ICD-10-CM | POA: Diagnosis not present

## 2018-05-16 DIAGNOSIS — Z79899 Other long term (current) drug therapy: Secondary | ICD-10-CM | POA: Diagnosis not present

## 2018-05-16 DIAGNOSIS — R8290 Unspecified abnormal findings in urine: Secondary | ICD-10-CM | POA: Diagnosis not present

## 2018-05-16 DIAGNOSIS — I10 Essential (primary) hypertension: Secondary | ICD-10-CM | POA: Diagnosis not present

## 2018-06-11 DIAGNOSIS — S42035D Nondisplaced fracture of lateral end of left clavicle, subsequent encounter for fracture with routine healing: Secondary | ICD-10-CM | POA: Diagnosis not present

## 2018-06-24 DIAGNOSIS — H52223 Regular astigmatism, bilateral: Secondary | ICD-10-CM | POA: Diagnosis not present

## 2018-06-24 DIAGNOSIS — H5203 Hypermetropia, bilateral: Secondary | ICD-10-CM | POA: Diagnosis not present

## 2018-06-24 DIAGNOSIS — H25813 Combined forms of age-related cataract, bilateral: Secondary | ICD-10-CM | POA: Diagnosis not present

## 2018-06-24 DIAGNOSIS — H524 Presbyopia: Secondary | ICD-10-CM | POA: Diagnosis not present

## 2018-06-24 DIAGNOSIS — Z0100 Encounter for examination of eyes and vision without abnormal findings: Secondary | ICD-10-CM | POA: Diagnosis not present

## 2018-07-08 DIAGNOSIS — E039 Hypothyroidism, unspecified: Secondary | ICD-10-CM | POA: Diagnosis not present

## 2018-07-08 DIAGNOSIS — I10 Essential (primary) hypertension: Secondary | ICD-10-CM | POA: Diagnosis not present

## 2018-07-08 DIAGNOSIS — F411 Generalized anxiety disorder: Secondary | ICD-10-CM | POA: Diagnosis not present

## 2018-07-08 DIAGNOSIS — R7309 Other abnormal glucose: Secondary | ICD-10-CM | POA: Diagnosis not present

## 2018-11-29 IMAGING — DX DG SHOULDER 2+V*L*
3 series · 3 of 3 positions shown · non-contrast
Comparison: None.

CLINICAL DATA: Pain following fall

EXAM:
LEFT SHOULDER - 2+ VIEW

[shoulder obl (1 of 2)]
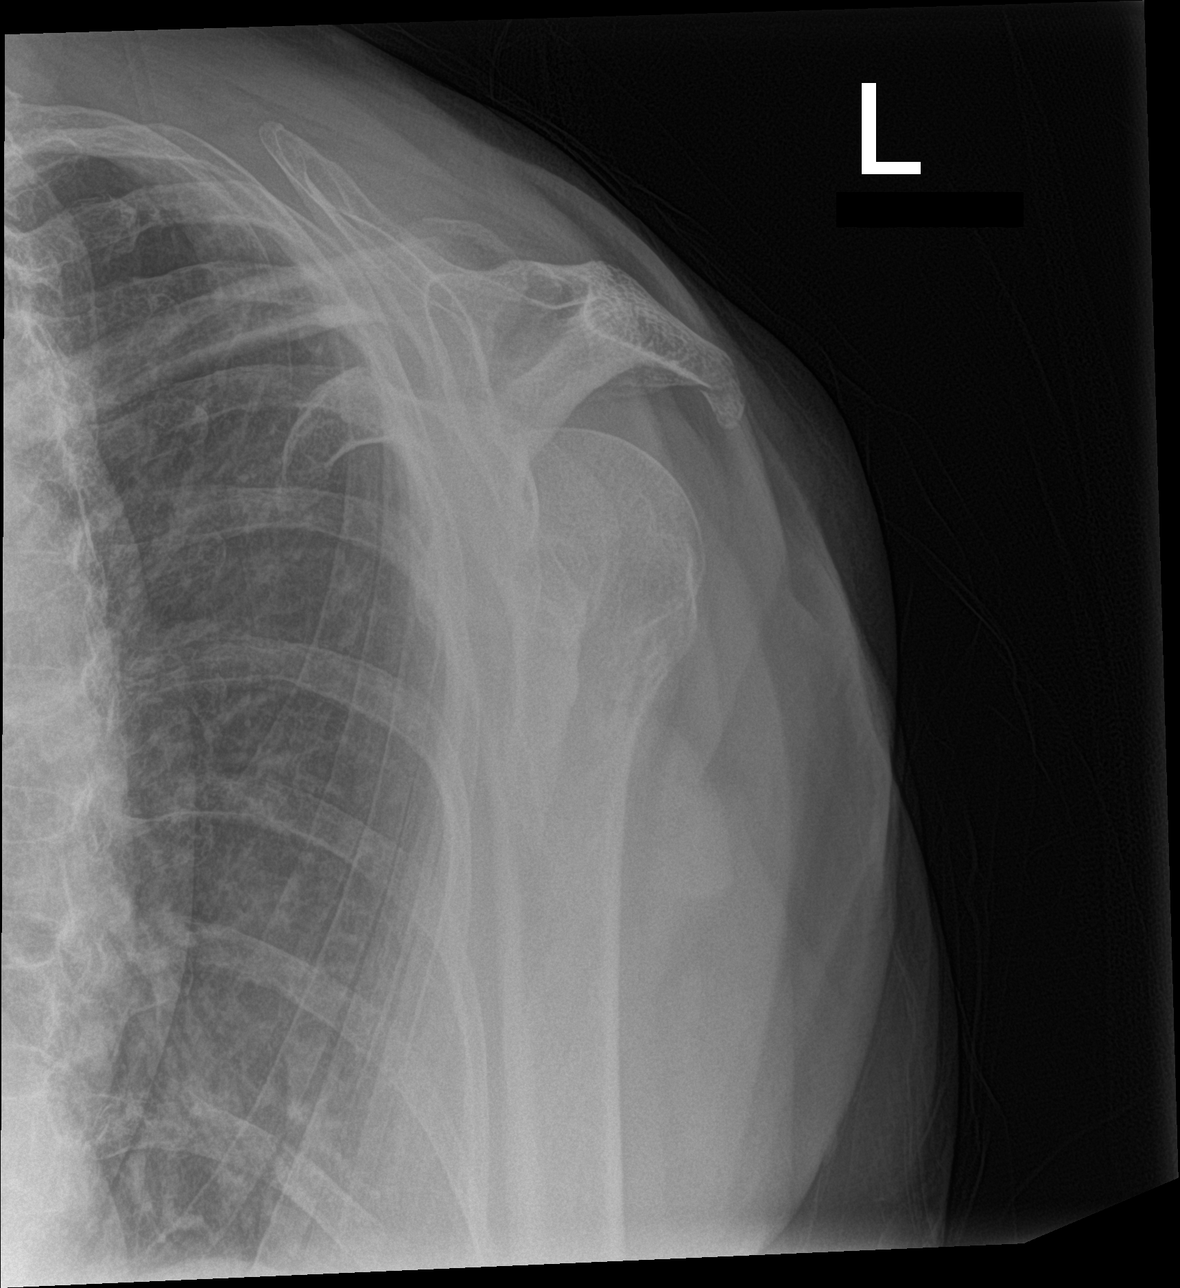

[shoulder obl (2 of 2)]
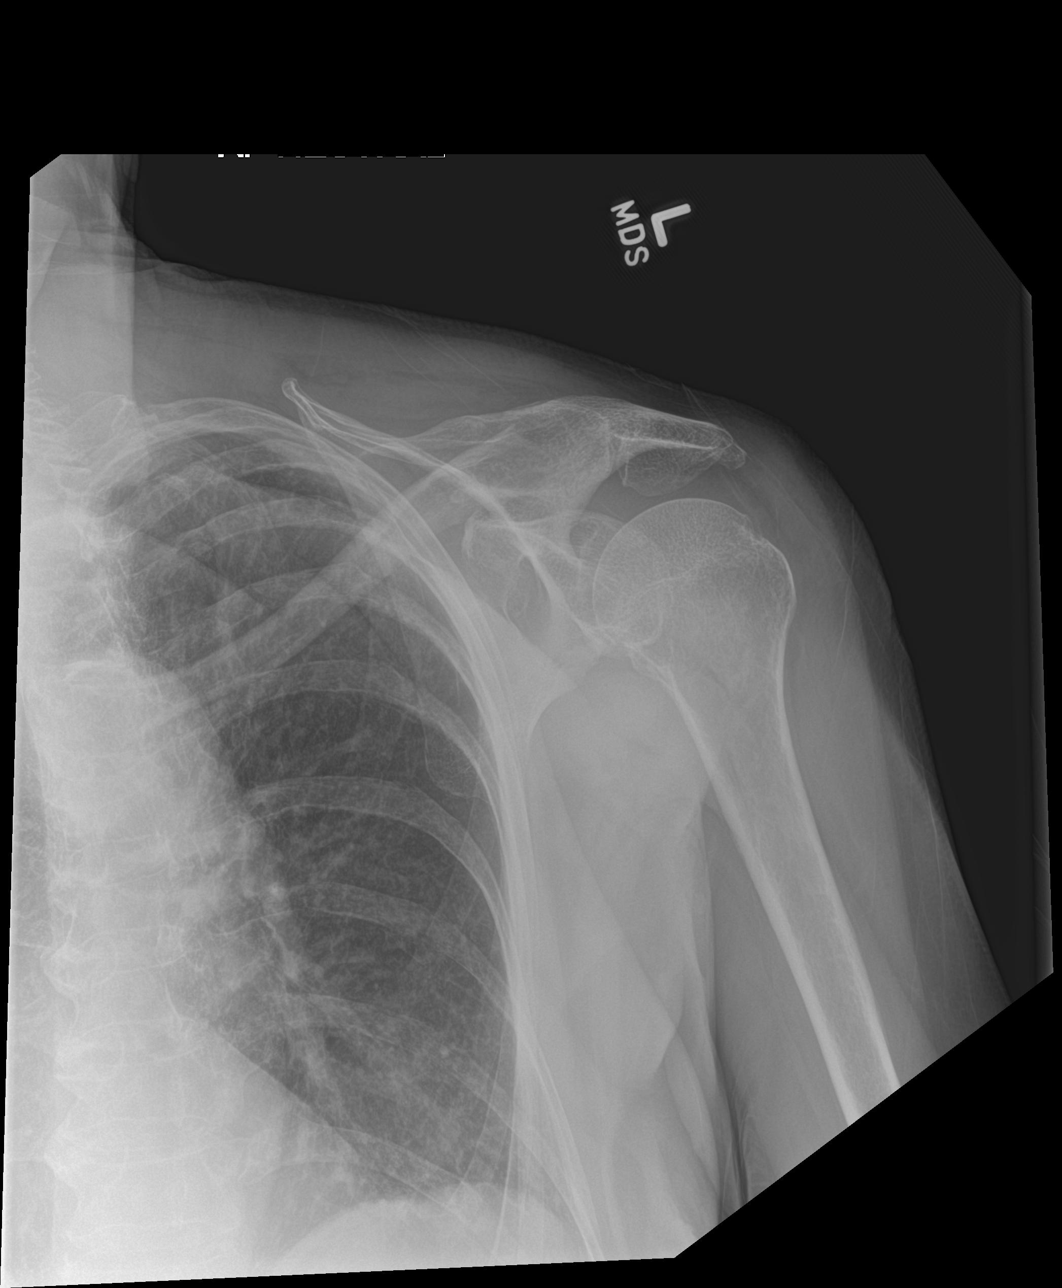

[shoulder ap]
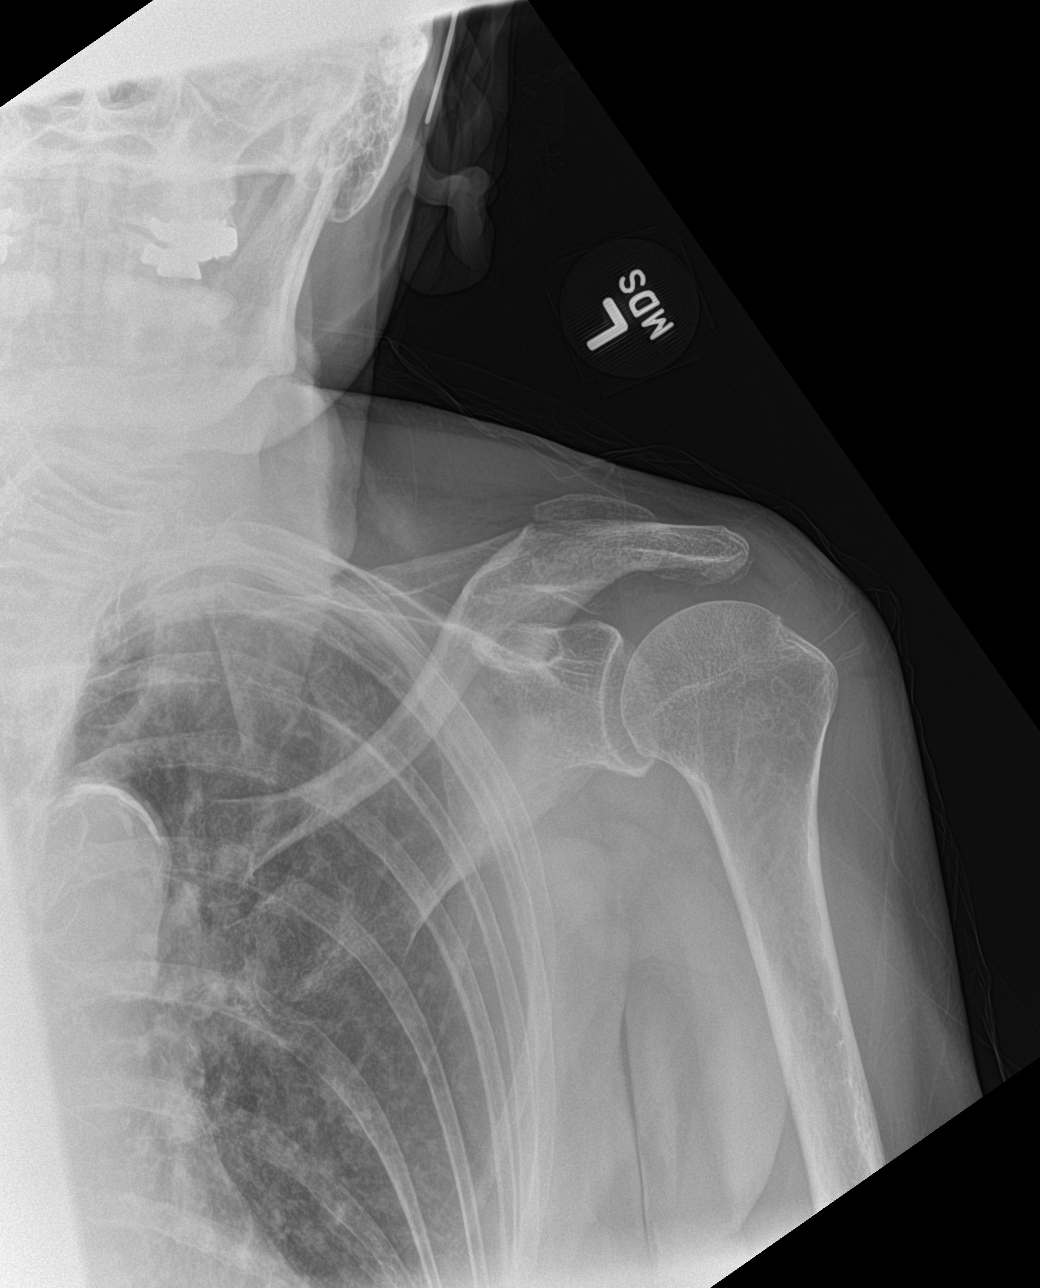

[3 of 3 positions shown; findings below may reference images not displayed]

FINDINGS: Frontal, oblique, and Y scapular images were obtained. There is a
fracture of the lateral aspect of the clavicle with slight
displacement of fracture fragments. There is also a fracture along
the lateral aspect of the acromion with alignment near anatomic in
this area. No other fractures are appreciable. No dislocation. Joint
spaces appear unremarkable. No erosive change. The visualized left
lung is clear. There is aortic atherosclerosis.
IMPRESSION: Fracture lateral clavicle with mild displacement of fracture
fragments. There is also a fracture of the lateral acromion with
alignment near anatomic. No other fractures are evident. No
dislocation. No appreciable arthropathy.

There is aortic atherosclerosis.

Aortic Atherosclerosis ([id number].I).

## 2019-05-12 DIAGNOSIS — L603 Nail dystrophy: Secondary | ICD-10-CM | POA: Diagnosis not present

## 2019-05-12 DIAGNOSIS — E039 Hypothyroidism, unspecified: Secondary | ICD-10-CM | POA: Diagnosis not present

## 2019-05-12 DIAGNOSIS — Z79899 Other long term (current) drug therapy: Secondary | ICD-10-CM | POA: Diagnosis not present

## 2019-05-12 DIAGNOSIS — Z23 Encounter for immunization: Secondary | ICD-10-CM | POA: Diagnosis not present

## 2019-05-12 DIAGNOSIS — Z0000 Encounter for general adult medical examination without abnormal findings: Secondary | ICD-10-CM | POA: Diagnosis not present

## 2019-05-12 DIAGNOSIS — I480 Paroxysmal atrial fibrillation: Secondary | ICD-10-CM | POA: Diagnosis not present

## 2019-05-12 DIAGNOSIS — I10 Essential (primary) hypertension: Secondary | ICD-10-CM | POA: Diagnosis not present

## 2019-05-12 DIAGNOSIS — R7309 Other abnormal glucose: Secondary | ICD-10-CM | POA: Diagnosis not present

## 2019-05-26 DIAGNOSIS — R600 Localized edema: Secondary | ICD-10-CM | POA: Diagnosis not present

## 2019-05-26 DIAGNOSIS — R0602 Shortness of breath: Secondary | ICD-10-CM | POA: Diagnosis not present

## 2019-05-26 DIAGNOSIS — I10 Essential (primary) hypertension: Secondary | ICD-10-CM | POA: Diagnosis not present

## 2019-05-26 DIAGNOSIS — I441 Atrioventricular block, second degree: Secondary | ICD-10-CM | POA: Diagnosis not present

## 2019-05-26 DIAGNOSIS — I480 Paroxysmal atrial fibrillation: Secondary | ICD-10-CM | POA: Diagnosis not present

## 2019-06-08 ENCOUNTER — Ambulatory Visit: Payer: Medicare HMO | Admitting: Podiatry

## 2019-06-08 ENCOUNTER — Other Ambulatory Visit: Payer: Self-pay

## 2019-06-08 DIAGNOSIS — B351 Tinea unguium: Secondary | ICD-10-CM | POA: Diagnosis not present

## 2019-06-08 DIAGNOSIS — Q828 Other specified congenital malformations of skin: Secondary | ICD-10-CM | POA: Diagnosis not present

## 2019-06-08 DIAGNOSIS — M79676 Pain in unspecified toe(s): Secondary | ICD-10-CM

## 2019-06-08 NOTE — Progress Notes (Signed)
She presents today chief complaint of painfully elongated toenails 1 through 5 bilaterally.  Objective: Vital signs are stable she is alert and oriented x3.  Toenails are long thick yellow dystrophic-like mycotic pulses are palpable.  No open lesions or wounds she does have porokeratotic lesions plantar aspect of the foot bilaterally.  Assessment: Porokeratosis.  Pain in limb secondary to onychomycosis.  Plan: Debridement of toenails 1 through 5 bilaterally.  Debridement of reactive hyperkeratotic tissue bilateral foot.

## 2019-12-08 DIAGNOSIS — I441 Atrioventricular block, second degree: Secondary | ICD-10-CM | POA: Diagnosis not present

## 2019-12-08 DIAGNOSIS — R0602 Shortness of breath: Secondary | ICD-10-CM | POA: Diagnosis not present

## 2019-12-08 DIAGNOSIS — I10 Essential (primary) hypertension: Secondary | ICD-10-CM | POA: Diagnosis not present

## 2019-12-08 DIAGNOSIS — R600 Localized edema: Secondary | ICD-10-CM | POA: Diagnosis not present

## 2019-12-08 DIAGNOSIS — I480 Paroxysmal atrial fibrillation: Secondary | ICD-10-CM | POA: Diagnosis not present

## 2020-02-17 DIAGNOSIS — R21 Rash and other nonspecific skin eruption: Secondary | ICD-10-CM | POA: Diagnosis not present

## 2020-05-13 DIAGNOSIS — E039 Hypothyroidism, unspecified: Secondary | ICD-10-CM | POA: Diagnosis not present

## 2020-05-13 DIAGNOSIS — I480 Paroxysmal atrial fibrillation: Secondary | ICD-10-CM | POA: Diagnosis not present

## 2020-05-13 DIAGNOSIS — Z79899 Other long term (current) drug therapy: Secondary | ICD-10-CM | POA: Diagnosis not present

## 2020-05-13 DIAGNOSIS — J439 Emphysema, unspecified: Secondary | ICD-10-CM | POA: Diagnosis not present

## 2020-05-13 DIAGNOSIS — R7309 Other abnormal glucose: Secondary | ICD-10-CM | POA: Diagnosis not present

## 2020-05-13 DIAGNOSIS — I10 Essential (primary) hypertension: Secondary | ICD-10-CM | POA: Diagnosis not present

## 2020-05-13 DIAGNOSIS — F411 Generalized anxiety disorder: Secondary | ICD-10-CM | POA: Diagnosis not present

## 2020-05-13 DIAGNOSIS — Z0000 Encounter for general adult medical examination without abnormal findings: Secondary | ICD-10-CM | POA: Diagnosis not present

## 2020-06-03 DIAGNOSIS — H2513 Age-related nuclear cataract, bilateral: Secondary | ICD-10-CM | POA: Diagnosis not present

## 2020-06-03 DIAGNOSIS — H43813 Vitreous degeneration, bilateral: Secondary | ICD-10-CM | POA: Diagnosis not present

## 2020-06-08 DIAGNOSIS — I480 Paroxysmal atrial fibrillation: Secondary | ICD-10-CM | POA: Diagnosis not present

## 2020-06-08 DIAGNOSIS — Z23 Encounter for immunization: Secondary | ICD-10-CM | POA: Diagnosis not present

## 2020-06-08 DIAGNOSIS — I10 Essential (primary) hypertension: Secondary | ICD-10-CM | POA: Diagnosis not present

## 2020-06-08 DIAGNOSIS — I441 Atrioventricular block, second degree: Secondary | ICD-10-CM | POA: Diagnosis not present

## 2020-06-08 DIAGNOSIS — R600 Localized edema: Secondary | ICD-10-CM | POA: Diagnosis not present

## 2020-07-15 DIAGNOSIS — H2512 Age-related nuclear cataract, left eye: Secondary | ICD-10-CM | POA: Diagnosis not present

## 2020-07-15 DIAGNOSIS — I10 Essential (primary) hypertension: Secondary | ICD-10-CM | POA: Diagnosis not present

## 2020-07-19 ENCOUNTER — Encounter: Payer: Self-pay | Admitting: Ophthalmology

## 2020-07-27 ENCOUNTER — Other Ambulatory Visit
Admission: RE | Admit: 2020-07-27 | Discharge: 2020-07-27 | Disposition: A | Discharge status: Home / Self Care | Payer: Medicare HMO | Source: Ambulatory Visit | Attending: Ophthalmology | Admitting: Ophthalmology

## 2020-07-27 DIAGNOSIS — Z20822 Contact with and (suspected) exposure to covid-19: Secondary | ICD-10-CM | POA: Insufficient documentation

## 2020-07-27 DIAGNOSIS — Z01812 Encounter for preprocedural laboratory examination: Secondary | ICD-10-CM | POA: Diagnosis not present

## 2020-07-27 LAB — SARS CORONAVIRUS 2 (TAT 6-24 HRS): SARS Coronavirus 2: NEGATIVE

## 2020-07-27 NOTE — Discharge Instructions (Signed)

## 2020-07-29 ENCOUNTER — Encounter: Payer: Self-pay | Admitting: Ophthalmology

## 2020-07-29 ENCOUNTER — Other Ambulatory Visit: Payer: Self-pay

## 2020-07-29 ENCOUNTER — Ambulatory Visit
Admission: RE | Admit: 2020-07-29 | Discharge: 2020-07-29 | Disposition: A | Discharge status: Home / Self Care | Payer: Medicare HMO | Attending: Ophthalmology | Admitting: Ophthalmology

## 2020-07-29 ENCOUNTER — Encounter
Admission: RE | Disposition: A | Discharge status: Home / Self Care | Payer: Self-pay | Source: Home / Self Care | Attending: Ophthalmology

## 2020-07-29 ENCOUNTER — Ambulatory Visit: Payer: Medicare HMO | Admitting: Anesthesiology

## 2020-07-29 DIAGNOSIS — Z881 Allergy status to other antibiotic agents status: Secondary | ICD-10-CM | POA: Insufficient documentation

## 2020-07-29 DIAGNOSIS — Z79890 Hormone replacement therapy (postmenopausal): Secondary | ICD-10-CM | POA: Insufficient documentation

## 2020-07-29 DIAGNOSIS — Z79899 Other long term (current) drug therapy: Secondary | ICD-10-CM | POA: Diagnosis not present

## 2020-07-29 DIAGNOSIS — Z7982 Long term (current) use of aspirin: Secondary | ICD-10-CM | POA: Diagnosis not present

## 2020-07-29 DIAGNOSIS — H2512 Age-related nuclear cataract, left eye: Secondary | ICD-10-CM | POA: Diagnosis not present

## 2020-07-29 DIAGNOSIS — H25812 Combined forms of age-related cataract, left eye: Secondary | ICD-10-CM | POA: Diagnosis not present

## 2020-07-29 HISTORY — PX: CATARACT EXTRACTION W/PHACO: SHX586

## 2020-07-29 SURGERY — PHACOEMULSIFICATION, CATARACT, WITH IOL INSERTION
Anesthesia: Monitor Anesthesia Care | Site: Eye | Laterality: Left

## 2020-07-29 MED ORDER — MOXIFLOXACIN HCL 0.5 % OP SOLN
OPHTHALMIC | Status: DC | PRN
  Administered 2020-07-29: 0.2 mL via OPHTHALMIC

## 2020-07-29 MED ORDER — PROMETHAZINE HCL 25 MG/ML IJ SOLN: 6.2500 mg | INTRAMUSCULAR | Status: DC | PRN

## 2020-07-29 MED ORDER — LIDOCAINE HCL (PF) 2 % IJ SOLN
INTRAOCULAR | Status: DC | PRN
  Administered 2020-07-29: 1 mL

## 2020-07-29 MED ORDER — ARMC OPHTHALMIC DILATING DROPS
1.0000 "application " | OPHTHALMIC | Status: DC | PRN
  Administered 2020-07-29 (×3): 1 via OPHTHALMIC

## 2020-07-29 MED ORDER — EPINEPHRINE PF 1 MG/ML IJ SOLN
INTRAOCULAR | Status: DC | PRN
  Administered 2020-07-29: 10:00:00 72 mL via OPHTHALMIC

## 2020-07-29 MED ORDER — FENTANYL CITRATE (PF) 100 MCG/2ML IJ SOLN
INTRAMUSCULAR | Status: DC | PRN
  Administered 2020-07-29 (×2): 25 ug via INTRAVENOUS

## 2020-07-29 MED ORDER — TETRACAINE HCL 0.5 % OP SOLN
1.0000 [drp] | OPHTHALMIC | Status: DC | PRN
  Administered 2020-07-29 (×3): 1 [drp] via OPHTHALMIC

## 2020-07-29 MED ORDER — NA HYALUR & NA CHOND-NA HYALUR 0.4-0.35 ML IO KIT
PACK | INTRAOCULAR | Status: DC | PRN
  Administered 2020-07-29: 1 mL via INTRAOCULAR

## 2020-07-29 MED ORDER — LACTATED RINGERS IV SOLN: INTRAVENOUS | Status: DC

## 2020-07-29 MED ORDER — FENTANYL CITRATE (PF) 100 MCG/2ML IJ SOLN: 25.0000 ug | INTRAMUSCULAR | Status: DC | PRN

## 2020-07-29 MED ORDER — MEPERIDINE HCL 25 MG/ML IJ SOLN: 6.2500 mg | INTRAMUSCULAR | Status: DC | PRN

## 2020-07-29 MED ORDER — BRIMONIDINE TARTRATE-TIMOLOL 0.2-0.5 % OP SOLN
OPHTHALMIC | Status: DC | PRN
  Administered 2020-07-29: 1 [drp] via OPHTHALMIC

## 2020-07-29 MED ORDER — OXYCODONE HCL 5 MG PO TABS: 5.0000 mg | ORAL_TABLET | Freq: Once | ORAL | Status: DC | PRN

## 2020-07-29 MED ORDER — OXYCODONE HCL 5 MG/5ML PO SOLN: 5.0000 mg | Freq: Once | ORAL | Status: DC | PRN

## 2020-07-29 SURGICAL SUPPLY — 24 items
CANNULA ANT/CHMB 27G (MISCELLANEOUS) ×1 IMPLANT
CANNULA ANT/CHMB 27GA (MISCELLANEOUS) ×3 IMPLANT
GLOVE SURG LX 7.5 STRW (GLOVE) ×2
GLOVE SURG LX STRL 7.5 STRW (GLOVE) ×1 IMPLANT
GLOVE SURG TRIUMPH 8.0 PF LTX (GLOVE) ×3 IMPLANT
GOWN STRL REUS W/ TWL LRG LVL3 (GOWN DISPOSABLE) ×2 IMPLANT
GOWN STRL REUS W/TWL LRG LVL3 (GOWN DISPOSABLE) ×6
LENS IOL DIOP 20.5 (Intraocular Lens) ×3 IMPLANT
LENS IOL TECNIS MONO 20.5 (Intraocular Lens) IMPLANT
MARKER SKIN DUAL TIP RULER LAB (MISCELLANEOUS) ×3 IMPLANT
NDL CAPSULORHEX 25GA (NEEDLE) ×1 IMPLANT
NDL FILTER BLUNT 18X1 1/2 (NEEDLE) ×2 IMPLANT
NEEDLE CAPSULORHEX 25GA (NEEDLE) ×3 IMPLANT
NEEDLE FILTER BLUNT 18X 1/2SAF (NEEDLE) ×4
NEEDLE FILTER BLUNT 18X1 1/2 (NEEDLE) ×2 IMPLANT
PACK CATARACT BRASINGTON (MISCELLANEOUS) ×3 IMPLANT
PACK EYE AFTER SURG (MISCELLANEOUS) ×3 IMPLANT
PACK OPTHALMIC (MISCELLANEOUS) ×3 IMPLANT
SOLUTION OPHTHALMIC SALT (MISCELLANEOUS) ×3 IMPLANT
SYR 3ML LL SCALE MARK (SYRINGE) ×6 IMPLANT
SYR TB 1ML LUER SLIP (SYRINGE) ×3 IMPLANT
WATER STERILE IRR 250ML POUR (IV SOLUTION) ×3 IMPLANT
WICK EYE OCUCEL (MISCELLANEOUS) ×2 IMPLANT
WIPE NON LINTING 3.25X3.25 (MISCELLANEOUS) ×3 IMPLANT

## 2020-07-29 NOTE — Op Note (Signed)
OPERATIVE NOTE  Destiny Burns [id number] 07/29/2020   PREOPERATIVE DIAGNOSIS:  Nuclear sclerotic cataract left eye. H25.12   POSTOPERATIVE DIAGNOSIS:    Nuclear sclerotic cataract left eye.     PROCEDURE:  Phacoemusification with posterior chamber intraocular lens placement of the left eye  Ultrasound time: Procedure(s): CATARACT EXTRACTION PHACO AND INTRAOCULAR LENS PLACEMENT (IOC) LEFT 11.43 01;13.5 15.6% (Left)  LENS:   Implant Name Type Inv. Item Serial No. Manufacturer Lot No. LRB No. Used Action  LENS IOL DIOP 20.5 - [id number] Intraocular Lens LENS IOL DIOP 20.5 [id number] JOHNSON   Left 1 Implanted      SURGEON:  Wyonia Hough, MD   ANESTHESIA:  Topical with tetracaine drops and 2% Xylocaine jelly, augmented with 1% preservative-free intracameral lidocaine.    COMPLICATIONS:  None.   DESCRIPTION OF PROCEDURE:  The patient was identified in the holding room and transported to the operating room and placed in the supine position under the operating microscope.  The left eye was identified as the operative eye and it was prepped and draped in the usual sterile ophthalmic fashion.   A 1 millimeter clear-corneal paracentesis was made at the 1:30 position.  0.5 ml of preservative-free 1% lidocaine was injected into the anterior chamber.  The anterior chamber was filled with Viscoat viscoelastic.  A 2.4 millimeter keratome was used to make a near-clear corneal incision at the 10:30 position.  .  A curvilinear capsulorrhexis was made with a cystotome and capsulorrhexis forceps.  Balanced salt solution was used to hydrodissect and hydrodelineate the nucleus.   Phacoemulsification was then used in stop and chop fashion to remove the lens nucleus and epinucleus.  The remaining cortex was then removed using the irrigation and aspiration handpiece. Provisc was then placed into the capsular bag to distend it for lens placement.  A lens was then injected into the capsular bag.   The remaining viscoelastic was aspirated.   Wounds were hydrated with balanced salt solution.  The anterior chamber was inflated to a physiologic pressure with balanced salt solution.  No wound leaks were noted. Vigamox 0.2 ml of a 1mg  per ml solution was injected into the anterior chamber for a dose of 0.2 mg of intracameral antibiotic at the completion of the case.   Timolol and Brimonidine drops were applied to the eye.  The patient was taken to the recovery room in stable condition without complications of anesthesia or surgery.  [name] 07/29/2020, 10:12 AM

## 2020-07-29 NOTE — H&P (Signed)

## 2020-07-29 NOTE — Anesthesia Postprocedure Evaluation (Signed)
Anesthesia Post Note  Patient: Naval architect  Procedure(s) Performed: CATARACT EXTRACTION PHACO AND INTRAOCULAR LENS PLACEMENT (IOC) LEFT 11.43 01;13.5 15.6% (Left Eye)     Patient location during evaluation: PACU Anesthesia Type: MAC Level of consciousness: awake and alert Pain management: pain level controlled Vital Signs Assessment: post-procedure vital signs reviewed and stable Respiratory status: spontaneous breathing, nonlabored ventilation, respiratory function stable and patient connected to nasal cannula oxygen Cardiovascular status: stable and blood pressure returned to baseline Postop Assessment: no apparent nausea or vomiting Anesthetic complications: no   No complications documented.  Amerie Beaumont, Glade Stanford

## 2020-07-29 NOTE — Anesthesia Preprocedure Evaluation (Signed)
Anesthesia Evaluation  Patient identified by MRN, date of birth, ID band Patient awake    Reviewed: Allergy & Precautions, H&P , NPO status , Patient's Chart, lab work & pertinent test results, reviewed documented beta blocker date and time   Airway Mallampati: II  TM Distance: >3 FB Neck ROM: full    Dental no notable dental hx.    Pulmonary neg pulmonary ROS,    Pulmonary exam normal breath sounds clear to auscultation       Cardiovascular Exercise Tolerance: Good hypertension, + dysrhythmias Atrial Fibrillation  Rhythm:regular Rate:Normal     Neuro/Psych PSYCHIATRIC DISORDERS Anxiety negative neurological ROS     GI/Hepatic Neg liver ROS, GERD  ,  Endo/Other  Hypothyroidism   Renal/GU negative Renal ROS  negative genitourinary   Musculoskeletal   Abdominal   Peds  Hematology negative hematology ROS (+) Blood dyscrasia, anemia ,   Anesthesia Other Findings   Reproductive/Obstetrics negative OB ROS                             Anesthesia Physical Anesthesia Plan  ASA: III  Anesthesia Plan: MAC   Post-op Pain Management:    Induction:   PONV Risk Score and Plan: 2 and Midazolam and Treatment may vary due to age or medical condition  Airway Management Planned:   Additional Equipment:   Intra-op Plan:   Post-operative Plan:   Informed Consent: I have reviewed the patients History and Physical, chart, labs and discussed the procedure including the risks, benefits and alternatives for the proposed anesthesia with the patient or authorized representative who has indicated his/her understanding and acceptance.     Dental Advisory Given  Plan Discussed with: CRNA  Anesthesia Plan Comments:         Anesthesia Quick Evaluation

## 2020-07-29 NOTE — Transfer of Care (Signed)
Immediate Anesthesia Transfer of Care Note  Patient: Destiny Burns  Procedure(s) Performed: CATARACT EXTRACTION PHACO AND INTRAOCULAR LENS PLACEMENT (IOC) LEFT 11.43 01;13.5 15.6% (Left Eye)  Patient Location: PACU  Anesthesia Type: MAC  Level of Consciousness: awake, alert  and patient cooperative  Airway and Oxygen Therapy: Patient Spontanous Breathing and Patient connected to supplemental oxygen  Post-op Assessment: Post-op Vital signs reviewed, Patient's Cardiovascular Status Stable, Respiratory Function Stable, Patent Airway and No signs of Nausea or vomiting  Post-op Vital Signs: Reviewed and stable  Complications: No complications documented.

## 2020-07-29 NOTE — Anesthesia Procedure Notes (Signed)
Procedure Name: MAC Date/Time: 07/29/2020 9:52 AM Performed by: Silvana Newness, CRNA Pre-anesthesia Checklist: Patient identified, Emergency Drugs available, Suction available, Patient being monitored and Timeout performed Patient Re-evaluated:Patient Re-evaluated prior to induction Oxygen Delivery Method: Nasal cannula Placement Confirmation: positive ETCO2

## 2020-08-01 ENCOUNTER — Encounter: Payer: Self-pay | Admitting: Ophthalmology

## 2020-11-04 DIAGNOSIS — I10 Essential (primary) hypertension: Secondary | ICD-10-CM | POA: Diagnosis not present

## 2020-11-04 DIAGNOSIS — E079 Disorder of thyroid, unspecified: Secondary | ICD-10-CM | POA: Diagnosis not present

## 2020-11-04 DIAGNOSIS — R739 Hyperglycemia, unspecified: Secondary | ICD-10-CM | POA: Diagnosis not present

## 2020-11-04 DIAGNOSIS — Z0000 Encounter for general adult medical examination without abnormal findings: Secondary | ICD-10-CM | POA: Diagnosis not present

## 2020-11-04 DIAGNOSIS — R8290 Unspecified abnormal findings in urine: Secondary | ICD-10-CM | POA: Diagnosis not present

## 2020-11-04 DIAGNOSIS — I480 Paroxysmal atrial fibrillation: Secondary | ICD-10-CM | POA: Diagnosis not present

## 2020-11-24 DIAGNOSIS — H2511 Age-related nuclear cataract, right eye: Secondary | ICD-10-CM | POA: Diagnosis not present

## 2020-11-29 ENCOUNTER — Other Ambulatory Visit: Payer: Self-pay

## 2020-11-29 ENCOUNTER — Encounter: Payer: Self-pay | Admitting: Ophthalmology

## 2020-12-05 NOTE — Discharge Instructions (Signed)

## 2020-12-07 ENCOUNTER — Ambulatory Visit: Payer: Medicare HMO | Admitting: Anesthesiology

## 2020-12-07 ENCOUNTER — Ambulatory Visit
Admission: RE | Admit: 2020-12-07 | Discharge: 2020-12-07 | Disposition: A | Discharge status: Home / Self Care | Payer: Medicare HMO | Attending: Ophthalmology | Admitting: Ophthalmology

## 2020-12-07 ENCOUNTER — Encounter
Admission: RE | Disposition: A | Discharge status: Home / Self Care | Payer: Self-pay | Source: Home / Self Care | Attending: Ophthalmology

## 2020-12-07 ENCOUNTER — Other Ambulatory Visit: Payer: Self-pay

## 2020-12-07 ENCOUNTER — Encounter: Payer: Self-pay | Admitting: Ophthalmology

## 2020-12-07 DIAGNOSIS — Z7982 Long term (current) use of aspirin: Secondary | ICD-10-CM | POA: Diagnosis not present

## 2020-12-07 DIAGNOSIS — Z79899 Other long term (current) drug therapy: Secondary | ICD-10-CM | POA: Insufficient documentation

## 2020-12-07 DIAGNOSIS — Z882 Allergy status to sulfonamides status: Secondary | ICD-10-CM | POA: Diagnosis not present

## 2020-12-07 DIAGNOSIS — H25811 Combined forms of age-related cataract, right eye: Secondary | ICD-10-CM | POA: Diagnosis not present

## 2020-12-07 DIAGNOSIS — Z961 Presence of intraocular lens: Secondary | ICD-10-CM | POA: Insufficient documentation

## 2020-12-07 DIAGNOSIS — H2511 Age-related nuclear cataract, right eye: Secondary | ICD-10-CM | POA: Diagnosis not present

## 2020-12-07 DIAGNOSIS — Z9842 Cataract extraction status, left eye: Secondary | ICD-10-CM | POA: Diagnosis not present

## 2020-12-07 DIAGNOSIS — Z880 Allergy status to penicillin: Secondary | ICD-10-CM | POA: Insufficient documentation

## 2020-12-07 DIAGNOSIS — Z79890 Hormone replacement therapy (postmenopausal): Secondary | ICD-10-CM | POA: Insufficient documentation

## 2020-12-07 HISTORY — PX: CATARACT EXTRACTION W/PHACO: SHX586

## 2020-12-07 SURGERY — PHACOEMULSIFICATION, CATARACT, WITH IOL INSERTION
Anesthesia: Monitor Anesthesia Care | Site: Eye | Laterality: Right

## 2020-12-07 MED ORDER — BRIMONIDINE TARTRATE-TIMOLOL 0.2-0.5 % OP SOLN
OPHTHALMIC | Status: DC | PRN
  Administered 2020-12-07: 1 [drp] via OPHTHALMIC

## 2020-12-07 MED ORDER — OXYCODONE HCL 5 MG/5ML PO SOLN: 5.0000 mg | Freq: Once | ORAL | Status: DC | PRN

## 2020-12-07 MED ORDER — NA HYALUR & NA CHOND-NA HYALUR 0.4-0.35 ML IO KIT
PACK | INTRAOCULAR | Status: DC | PRN
  Administered 2020-12-07: 1 mL via INTRAOCULAR

## 2020-12-07 MED ORDER — TETRACAINE HCL 0.5 % OP SOLN
1.0000 [drp] | OPHTHALMIC | Status: DC | PRN
  Administered 2020-12-07 (×3): 1 [drp] via OPHTHALMIC

## 2020-12-07 MED ORDER — LIDOCAINE HCL (PF) 2 % IJ SOLN
INTRAOCULAR | Status: DC | PRN
  Administered 2020-12-07: 2 mL

## 2020-12-07 MED ORDER — LACTATED RINGERS IV SOLN: INTRAVENOUS | Status: DC

## 2020-12-07 MED ORDER — MOXIFLOXACIN HCL 0.5 % OP SOLN
OPHTHALMIC | Status: DC | PRN
  Administered 2020-12-07: 0.2 mL via OPHTHALMIC

## 2020-12-07 MED ORDER — FENTANYL CITRATE (PF) 100 MCG/2ML IJ SOLN
INTRAMUSCULAR | Status: DC | PRN
  Administered 2020-12-07: 50 ug via INTRAVENOUS

## 2020-12-07 MED ORDER — OXYCODONE HCL 5 MG PO TABS: 5.0000 mg | ORAL_TABLET | Freq: Once | ORAL | Status: DC | PRN

## 2020-12-07 MED ORDER — ARMC OPHTHALMIC DILATING DROPS
1.0000 "application " | OPHTHALMIC | Status: DC | PRN
  Administered 2020-12-07 (×3): 1 via OPHTHALMIC

## 2020-12-07 MED ORDER — EPINEPHRINE PF 1 MG/ML IJ SOLN
INTRAOCULAR | Status: DC | PRN
  Administered 2020-12-07: 102 mL via OPHTHALMIC

## 2020-12-07 SURGICAL SUPPLY — 19 items
CANNULA ANT/CHMB 27GA (MISCELLANEOUS) ×2 IMPLANT
GLOVE SURG ENC TEXT LTX SZ7.5 (GLOVE) ×2 IMPLANT
GLOVE SURG TRIUMPH 8.0 PF LTX (GLOVE) ×2 IMPLANT
GOWN STRL REUS W/ TWL LRG LVL3 (GOWN DISPOSABLE) ×2 IMPLANT
GOWN STRL REUS W/TWL LRG LVL3 (GOWN DISPOSABLE) ×4
LENS IOL DIOP 21.0 (Intraocular Lens) ×2 IMPLANT
LENS IOL TECNIS MONO 21.0 (Intraocular Lens) ×1 IMPLANT
MARKER SKIN DUAL TIP RULER LAB (MISCELLANEOUS) ×2 IMPLANT
NEEDLE CAPSULORHEX 25GA (NEEDLE) ×2 IMPLANT
NEEDLE FILTER BLUNT 18X 1/2SAF (NEEDLE) ×2
NEEDLE FILTER BLUNT 18X1 1/2 (NEEDLE) ×2 IMPLANT
PACK CATARACT BRASINGTON (MISCELLANEOUS) ×2 IMPLANT
PACK EYE AFTER SURG (MISCELLANEOUS) ×2 IMPLANT
PACK OPTHALMIC (MISCELLANEOUS) ×2 IMPLANT
SOLUTION OPHTHALMIC SALT (MISCELLANEOUS) ×2 IMPLANT
SYR 3ML LL SCALE MARK (SYRINGE) ×4 IMPLANT
SYR TB 1ML LUER SLIP (SYRINGE) ×2 IMPLANT
WATER STERILE IRR 250ML POUR (IV SOLUTION) ×2 IMPLANT
WIPE NON LINTING 3.25X3.25 (MISCELLANEOUS) ×2 IMPLANT

## 2020-12-07 NOTE — Anesthesia Procedure Notes (Signed)
Procedure Name: MAC Date/Time: 12/07/2020 1:05 PM Performed by: Jeannene Patella, CRNA Pre-anesthesia Checklist: Patient identified, Emergency Drugs available, Suction available, Timeout performed and Patient being monitored Patient Re-evaluated:Patient Re-evaluated prior to induction Oxygen Delivery Method: Nasal cannula Placement Confirmation: positive ETCO2

## 2020-12-07 NOTE — H&P (Signed)
[facility]   Primary Care Physician:  Idelle Crouch, MD Ophthalmologist: Dr. Leandrew Koyanagi  Pre-Procedure History & Physical: HPI:  Danity Schmelzer is a 87 y.o. female here for ophthalmic surgery.   Past Medical History:  Diagnosis Date  . Abnormal glucose   . Anemia    as a child  . Arthritis   . Atrial fibrillation ([place])   . Cancer ([place])    basal cell  . Dupuytren contracture   . GAD (generalized anxiety disorder)   . GERD (gastroesophageal reflux disease)    no meds  . Hypertension   . Hypothyroidism   . Mobitz type 2 second degree atrioventricular block   . Pedal edema   . SOB (shortness of breath) on exertion   . Thyroid disease     Past Surgical History:  Procedure Laterality Date  . ABDOMINAL HYSTERECTOMY    . BREAST BIOPSY Left 11/08/15   neg  . BREAST BIOPSY Right    neg  . CATARACT EXTRACTION W/PHACO Left 07/29/2020   Procedure: CATARACT EXTRACTION PHACO AND INTRAOCULAR LENS PLACEMENT (IOC) LEFT 11.43 01;13.5 15.6%;  Surgeon: Leandrew Koyanagi, MD;  Location: [facility];  Service: Ophthalmology;  Laterality: Left;  . DILATION AND CURETTAGE OF UTERUS    . HAND SURGERY    . KYPHOPLASTY N/A 05/31/2015   Procedure: KYPHOPLASTY L3;  Surgeon: Hessie Knows, MD;  Location: [facility];  Service: Orthopedics;  Laterality: N/A;  . TONSILLECTOMY      Prior to Admission medications   Medication Sig Start Date End Date Taking? Authorizing Provider  acetaminophen (TYLENOL) 325 MG tablet Take 650 mg by mouth every 6 (six) hours as needed. Reported on 01/24/2016   Yes [provider]  aspirin 81 MG tablet Take 81 mg by mouth daily.   Yes [provider]  Calcium Carb-Cholecalciferol (CALCIUM-VITAMIN D) 500-400 MG-UNIT TABS Take by mouth.   Yes [provider]  cholecalciferol (VITAMIN D) 1000 UNITS tablet Take 1,000 Units by mouth daily.   Yes [provider]  diclofenac sodium (VOLTAREN) 1 % GEL Apply 2 g  topically 4 (four) times daily. 04/03/18  Yes Laban Emperor, PA-C  furosemide (LASIX) 20 MG tablet  05/01/19  Yes [provider]  hydrochlorothiazide (HYDRODIURIL) 25 MG tablet Take 1 tablet (25 mg total) by mouth daily. 07/21/15  Yes Jackolyn Confer, MD  isosorbide mononitrate (IMDUR) 30 MG 24 hr tablet  05/01/19  Yes [provider]  levothyroxine (SYNTHROID, LEVOTHROID) 25 MCG tablet Take 1 tablet (25 mcg total) by mouth daily. 07/21/15  Yes Jackolyn Confer, MD  lidocaine (LIDODERM) 5 % Place 1 patch onto the skin daily. Remove & Discard patch within 12 hours or as directed by MD 04/03/18  Yes Laban Emperor, PA-C  loratadine (CLARITIN) 10 MG tablet Take 10 mg by mouth.   Yes [provider]  LORazepam (ATIVAN) 0.5 MG tablet  05/15/19  Yes [provider]  mirabegron ER (MYRBETRIQ) 25 MG TB24 tablet Take 25 mg by mouth daily. Reported on 01/24/2016   Yes [provider]  mupirocin ointment (BACTROBAN) 2 % Apply topically. 02/29/16  Yes [provider]  nystatin (MYCOSTATIN/NYSTOP) powder APPLY TO AFFECTED AREA TWICE A DAY 10/20/18  Yes [provider]    Allergies as of 09/16/2020 - Review Complete 07/29/2020  Allergen Reaction Noted  . Amoxicillin Swelling 06/01/2011  . Sulfa antibiotics  01/13/2013    Family History  Problem Relation Age of Onset  . Dupuytren's  contracture Mother   . Breast cancer Mother 57  . Dupuytren's contracture Brother   . Hypertension Brother   . Breast cancer Sister 37  . Breast cancer Maternal Aunt 76    Social History   Socioeconomic History  . Marital status: Widowed    Spouse name: Not on file  . Number of children: Not on file  . Years of education: Not on file  . Highest education level: Not on file  Occupational History  . Not on file  Tobacco Use  . Smoking status: Never Smoker  . Smokeless tobacco: Never Used  Substance and Sexual Activity  . Alcohol use: No  . Drug use: No   . Sexual activity: Not on file  Other Topics Concern  . Not on file  Social History Narrative  . Not on file   Social Determinants of Health   Financial Resource Strain: Not on file  Food Insecurity: Not on file  Transportation Needs: Not on file  Physical Activity: Not on file  Stress: Not on file  Social Connections: Not on file  Intimate Partner Violence: Not on file    Review of Systems: See HPI, otherwise negative ROS  Physical Exam: Ht 5\' 1"  (1.549 m)   Wt 65.8 kg   BMI 27.40 kg/m  General:   Alert,  pleasant and cooperative in NAD Head:  Normocephalic and atraumatic. Lungs:  Clear to auscultation.    Heart:  Regular rate and rhythm.   Impression/Plan: Emunah Texidor is here for ophthalmic surgery.  Risks, benefits, limitations, and alternatives regarding ophthalmic surgery have been reviewed with the patient.  Questions have been answered.  All parties agreeable.   Leandrew Koyanagi, MD  12/07/2020, 12:35 PM

## 2020-12-07 NOTE — Op Note (Signed)
  LOCATION:  [facility]   PREOPERATIVE DIAGNOSIS:    Nuclear sclerotic cataract right eye. H25.11   POSTOPERATIVE DIAGNOSIS:  Nuclear sclerotic cataract right eye.     PROCEDURE:  Phacoemusification with posterior chamber intraocular lens placement of the right eye   ULTRASOUND TIME: Procedure(s): CATARACT EXTRACTION PHACO AND INTRAOCULAR LENS PLACEMENT (IOC) RIGHT 20.79 02:27.5 14.1% (Right)  LENS:   Implant Name Type Inv. Item Serial No. Manufacturer Lot No. LRB No. Used Action  LENS IOL DIOP 21.0 - [id number] Intraocular Lens LENS IOL DIOP 21.0 [id number]   Right 1 Implanted         SURGEON:  Wyonia Hough, MD   ANESTHESIA:  Topical with tetracaine drops and 2% Xylocaine jelly, augmented with 1% preservative-free intracameral lidocaine.    COMPLICATIONS:  None.   DESCRIPTION OF PROCEDURE:  The patient was identified in the holding room and transported to the operating room and placed in the supine position under the operating microscope.  The right eye was identified as the operative eye and it was prepped and draped in the usual sterile ophthalmic fashion.   A 1 millimeter clear-corneal paracentesis was made at the 12:00 position.  0.5 ml of preservative-free 1% lidocaine was injected into the anterior chamber. The anterior chamber was filled with Viscoat viscoelastic.  A 2.4 millimeter keratome was used to make a near-clear corneal incision at the 9:00 position.  A curvilinear capsulorrhexis was made with a cystotome and capsulorrhexis forceps.  Balanced salt solution was used to hydrodissect and hydrodelineate the nucleus.   Phacoemulsification was then used in stop and chop fashion to remove the lens nucleus and epinucleus.  The remaining cortex was then removed using the irrigation and aspiration handpiece. Provisc was then placed into the capsular bag to distend it for lens placement.  A lens was then injected into the capsular bag.  The remaining  viscoelastic was aspirated.   Wounds were hydrated with balanced salt solution.  The anterior chamber was inflated to a physiologic pressure with balanced salt solution.  No wound leaks were noted. Vigamox 0.2 ml of a 1mg  per ml solution was injected into the anterior chamber for a dose of 0.2 mg of intracameral antibiotic at the completion of the case.   Timolol and Brimonidine drops were applied to the eye.  The patient was taken to the recovery room in stable condition without complications of anesthesia or surgery.   Stephfon Bovey 12/07/2020, 1:24 PM

## 2020-12-07 NOTE — Anesthesia Preprocedure Evaluation (Signed)
Anesthesia Evaluation  Patient identified by MRN, date of birth, ID band Patient awake    Reviewed: Allergy & Precautions, H&P , NPO status , Patient's Chart, lab work & pertinent test results, reviewed documented beta blocker date and time   History of Anesthesia Complications Negative for: history of anesthetic complications  Airway Mallampati: II  TM Distance: >3 FB Neck ROM: full    Dental no notable dental hx.    Pulmonary neg pulmonary ROS,    Pulmonary exam normal breath sounds clear to auscultation       Cardiovascular Exercise Tolerance: Good hypertension, + dysrhythmias (Paroxysmal atrial fibrillation ) Atrial Fibrillation  Rhythm:regular Rate:Normal  echo: 2017:  NORMAL LEFT VENTRICULAR SYSTOLIC FUNCTION WITH MODERATE LVH  MILD VALVULAR REGURGITATION (See above)  NO VALVULAR STENOSIS  EF 55% ;  cards: 10/21: [place];  Mobitz type 2 second degree atrioventricular block    Neuro/Psych PSYCHIATRIC DISORDERS Anxiety negative neurological ROS     GI/Hepatic Neg liver ROS, GERD  Controlled,  Endo/Other  negative endocrine ROSHypothyroidism   Renal/GU negative Renal ROS Bladder dysfunction      Musculoskeletal  (+) Arthritis ,   Abdominal   Peds  Hematology  (+) anemia ,   Anesthesia Other Findings Poor historian;  pcp: 3/22: [place];   last Aspirin: 4/26;  had ECCE on 12/21;  Reproductive/Obstetrics negative OB ROS                             Anesthesia Physical  Anesthesia Plan  ASA: III  Anesthesia Plan: MAC   Post-op Pain Management:    Induction:   PONV Risk Score and Plan: 2 and Midazolam and TIVA  Airway Management Planned:   Additional Equipment:   Intra-op Plan:   Post-operative Plan:   Informed Consent: I have reviewed the patients History and Physical, chart, labs and discussed the procedure including the risks, benefits and alternatives  for the proposed anesthesia with the patient or authorized representative who has indicated his/her understanding and acceptance.     Dental Advisory Given  Plan Discussed with: CRNA  Anesthesia Plan Comments:         Anesthesia Quick Evaluation

## 2020-12-07 NOTE — Transfer of Care (Signed)
Immediate Anesthesia Transfer of Care Note  Patient: Destiny Burns  Procedure(s) Performed: CATARACT EXTRACTION PHACO AND INTRAOCULAR LENS PLACEMENT (IOC) RIGHT 20.79 02:27.5 14.1% (Right Eye)  Patient Location: PACU  Anesthesia Type: MAC  Level of Consciousness: awake, alert  and patient cooperative  Airway and Oxygen Therapy: Patient Spontanous Breathing and Patient connected to supplemental oxygen  Post-op Assessment: Post-op Vital signs reviewed, Patient's Cardiovascular Status Stable, Respiratory Function Stable, Patent Airway and No signs of Nausea or vomiting  Post-op Vital Signs: Reviewed and stable  Complications: No complications documented.

## 2020-12-07 NOTE — Anesthesia Postprocedure Evaluation (Signed)
Anesthesia Post Note  Patient: Naval architect  Procedure(s) Performed: CATARACT EXTRACTION PHACO AND INTRAOCULAR LENS PLACEMENT (IOC) RIGHT 20.79 02:27.5 14.1% (Right Eye)     Patient location during evaluation: PACU Anesthesia Type: MAC Level of consciousness: awake and alert Pain management: pain level controlled Vital Signs Assessment: post-procedure vital signs reviewed and stable Respiratory status: spontaneous breathing, nonlabored ventilation, respiratory function stable and patient connected to nasal cannula oxygen Cardiovascular status: stable and blood pressure returned to baseline Postop Assessment: no apparent nausea or vomiting Anesthetic complications: no   No complications documented.  Fidel Levy

## 2020-12-08 ENCOUNTER — Encounter: Payer: Self-pay | Admitting: Ophthalmology

## 2021-01-17 DIAGNOSIS — H35372 Puckering of macula, left eye: Secondary | ICD-10-CM | POA: Diagnosis not present

## 2021-01-17 DIAGNOSIS — Z961 Presence of intraocular lens: Secondary | ICD-10-CM | POA: Diagnosis not present

## 2021-03-02 DIAGNOSIS — Z79899 Other long term (current) drug therapy: Secondary | ICD-10-CM | POA: Diagnosis not present

## 2021-03-02 DIAGNOSIS — I480 Paroxysmal atrial fibrillation: Secondary | ICD-10-CM | POA: Diagnosis not present

## 2021-03-02 DIAGNOSIS — R7309 Other abnormal glucose: Secondary | ICD-10-CM | POA: Diagnosis not present

## 2021-03-02 DIAGNOSIS — I10 Essential (primary) hypertension: Secondary | ICD-10-CM | POA: Diagnosis not present

## 2021-03-02 DIAGNOSIS — E039 Hypothyroidism, unspecified: Secondary | ICD-10-CM | POA: Diagnosis not present

## 2021-03-06 DIAGNOSIS — I480 Paroxysmal atrial fibrillation: Secondary | ICD-10-CM | POA: Diagnosis not present

## 2021-03-06 DIAGNOSIS — E039 Hypothyroidism, unspecified: Secondary | ICD-10-CM | POA: Diagnosis not present

## 2021-03-06 DIAGNOSIS — R7309 Other abnormal glucose: Secondary | ICD-10-CM | POA: Diagnosis not present

## 2021-03-06 DIAGNOSIS — I10 Essential (primary) hypertension: Secondary | ICD-10-CM | POA: Diagnosis not present

## 2021-03-06 DIAGNOSIS — Z79899 Other long term (current) drug therapy: Secondary | ICD-10-CM | POA: Diagnosis not present

## 2021-06-06 DIAGNOSIS — I10 Essential (primary) hypertension: Secondary | ICD-10-CM | POA: Diagnosis not present

## 2021-06-06 DIAGNOSIS — E039 Hypothyroidism, unspecified: Secondary | ICD-10-CM | POA: Diagnosis not present

## 2021-06-06 DIAGNOSIS — Z23 Encounter for immunization: Secondary | ICD-10-CM | POA: Diagnosis not present

## 2021-06-21 ENCOUNTER — Encounter: Payer: Self-pay | Admitting: Podiatry

## 2021-06-21 ENCOUNTER — Other Ambulatory Visit: Payer: Self-pay

## 2021-06-21 ENCOUNTER — Ambulatory Visit: Payer: Medicare HMO | Admitting: Podiatry

## 2021-06-21 DIAGNOSIS — D2371 Other benign neoplasm of skin of right lower limb, including hip: Secondary | ICD-10-CM

## 2021-06-21 DIAGNOSIS — D2372 Other benign neoplasm of skin of left lower limb, including hip: Secondary | ICD-10-CM | POA: Diagnosis not present

## 2021-06-21 DIAGNOSIS — B351 Tinea unguium: Secondary | ICD-10-CM

## 2021-06-21 DIAGNOSIS — M79676 Pain in unspecified toe(s): Secondary | ICD-10-CM | POA: Diagnosis not present

## 2021-06-21 NOTE — Progress Notes (Signed)
She presents today chief complaint of painful elongated toenails have not seen her since 2020.  Objective: Vital signs stable she alert oriented x3 toenails are long thick yellow dystrophic onychomycotic multiple reactive hyperkeratotic mass in between the digits.  Pulses are palpable moderate edema bilateral venous congestion no open lesions or wounds are identified.  Assessment: Pain in limb secondary to onychomycosis 1 through 5 bilateral multiple benign skin lesions bilateral.  Plan: Debrided multiple benign skin lesions bilateral.  Debrided toenails 1 through 5 bilateral.

## 2021-06-28 ENCOUNTER — Ambulatory Visit: Payer: Medicare HMO | Admitting: Podiatry

## 2021-07-25 DIAGNOSIS — H35372 Puckering of macula, left eye: Secondary | ICD-10-CM | POA: Diagnosis not present

## 2021-07-28 DIAGNOSIS — Z23 Encounter for immunization: Secondary | ICD-10-CM | POA: Diagnosis not present

## 2021-08-23 ENCOUNTER — Emergency Department: Payer: Medicare HMO

## 2021-08-23 ENCOUNTER — Emergency Department
Admission: EM | Admit: 2021-08-23 | Discharge: 2021-08-23 | Disposition: A | Discharge status: Home / Self Care | Payer: Medicare HMO | Attending: Emergency Medicine | Admitting: Emergency Medicine

## 2021-08-23 ENCOUNTER — Other Ambulatory Visit: Payer: Self-pay

## 2021-08-23 DIAGNOSIS — I700 Atherosclerosis of aorta: Secondary | ICD-10-CM | POA: Diagnosis not present

## 2021-08-23 DIAGNOSIS — R109 Unspecified abdominal pain: Secondary | ICD-10-CM

## 2021-08-23 DIAGNOSIS — I10 Essential (primary) hypertension: Secondary | ICD-10-CM | POA: Diagnosis not present

## 2021-08-23 DIAGNOSIS — K529 Noninfective gastroenteritis and colitis, unspecified: Secondary | ICD-10-CM | POA: Diagnosis not present

## 2021-08-23 LAB — COMPREHENSIVE METABOLIC PANEL
ALT: 19 U/L (ref 0–44)
AST: 40 U/L (ref 15–41)
Albumin: 4 g/dL (ref 3.5–5.0)
Alkaline Phosphatase: 53 U/L (ref 38–126)
Anion gap: 7 (ref 5–15)
BUN: 21 mg/dL (ref 8–23)
CO2: 26 mmol/L (ref 22–32)
Calcium: 9.4 mg/dL (ref 8.9–10.3)
Chloride: 102 mmol/L (ref 98–111)
Creatinine, Ser: 0.69 mg/dL (ref 0.44–1.00)
GFR, Estimated: >60 mL/min (ref >60)
Glucose, Bld: 106 mg/dL — ABNORMAL HIGH (ref 70–99)
Potassium: 3.9 mmol/L (ref 3.5–5.1)
Sodium: 135 mmol/L (ref 135–145)
Total Bilirubin: 0.5 mg/dL (ref 0.3–1.2)
Total Protein: 6.9 g/dL (ref 6.5–8.1)

## 2021-08-23 LAB — CBC WITH DIFFERENTIAL/PLATELET
Abs Immature Granulocytes: 0.02 10*3/uL (ref 0.00–0.07)
Basophils Absolute: 0 10*3/uL (ref 0.0–0.1)
Basophils Relative: 0 %
Eosinophils Absolute: 0 10*3/uL (ref 0.0–0.5)
Eosinophils Relative: 1 %
HCT: 37.1 % (ref 36.0–46.0)
Hemoglobin: 12.4 g/dL (ref 12.0–15.0)
Immature Granulocytes: 0 %
Lymphocytes Relative: 19 %
Lymphs Abs: 0.9 10*3/uL (ref 0.7–4.0)
MCH: 30.7 pg (ref 26.0–34.0)
MCHC: 33.4 g/dL (ref 30.0–36.0)
MCV: 91.8 fL (ref 80.0–100.0)
Monocytes Absolute: 0.3 10*3/uL (ref 0.1–1.0)
Monocytes Relative: 7 %
Neutro Abs: 3.3 10*3/uL (ref 1.7–7.7)
Neutrophils Relative %: 73 %
Platelets: 120 10*3/uL — ABNORMAL LOW (ref 150–400)
RBC: 4.04 MIL/uL (ref 3.87–5.11)
RDW: 12.6 % (ref 11.5–15.5)
WBC: 4.6 10*3/uL (ref 4.0–10.5)
nRBC: 0 % (ref 0.0–0.2)

## 2021-08-23 LAB — LIPASE, BLOOD: Lipase: 78 U/L — ABNORMAL HIGH (ref 11–51)

## 2021-08-23 LAB — TROPONIN I (HIGH SENSITIVITY)
Troponin I (High Sensitivity): 7 ng/L (ref <18)
Troponin I (High Sensitivity): 9 ng/L (ref <18)

## 2021-08-23 MED ORDER — IOHEXOL 300 MG/ML  SOLN
100.0000 mL | Freq: Once | INTRAMUSCULAR | Status: AC | PRN
  Administered 2021-08-23: 100 mL via INTRAVENOUS
  Filled 2021-08-23: qty 100

## 2021-08-23 NOTE — ED Triage Notes (Signed)
Pt c/o pain entire R side from shoulder down following a fall x 1 week ago and L side abdominal pain starting today.  Pain score 8/10.  Denies n/v/d.  Pt sts urine is "dark."  Pt was seen at [facility] and thinks she has diverticulitis.

## 2021-08-23 NOTE — ED Provider Notes (Signed)
[facility] Provider Note    Event Date/Time   First MD Initiated Contact with Patient 08/23/21 2012     (approximate)   History   Abdominal Pain   HPI  Destiny Burns is a 88 y.o. female  who, per outpatient office note dated 10.25.22 has history of HTN, GERD, paroxysmal afib, presents to the emergency department today because of concern for abdominal pain. The patient states that she had a fall out of bed roughly 1 week ago. Fell onto her right side. Did not have any severe pain at that time, just some soreness. However today when she woke up she noticed increased pain to her right side as well as abdominal pain. The abdominal pain was located primarily in the left side. She denies any associated shortness of breath or nausea/vomiting. The patient denies any fevers. The patient does state that her pain has improved by the time of my exam. No recent fevers. Went to walk in clinic where they were concerned for possible diverticulitis.    Physical Exam   Triage Vital Signs: ED Triage Vitals  Enc Vitals Group     BP 08/23/21 1733 (!) 160/74     Pulse Rate 08/23/21 1733 72     Resp 08/23/21 1733 17     Temp 08/23/21 1733 98.4 F (36.9 C)     Temp Source 08/23/21 1733 Oral     SpO2 08/23/21 1733 93 %     Weight 08/23/21 1735 140 lb (63.5 kg)     Height 08/23/21 1735 5\' 1"  (1.549 m)     Head Circumference --      Peak Flow --      Pain Score 08/23/21 1749 8     Pain Loc --      Pain Edu? --      Excl. in [place]? --     Most recent vital signs: Vitals:   08/23/21 1733  BP: (!) 160/74  Pulse: 72  Resp: 17  Temp: 98.4 F (36.9 C)  SpO2: 93%    General: Awake, no distress.  CV:  Good peripheral perfusion.  Resp:  Normal effort.  Abd:  No distention.  MSK:  No deformity of extremities.    ED Results / Procedures / Treatments   Labs (all labs ordered are listed, but only abnormal results are displayed) Labs Reviewed  LIPASE, BLOOD -  Abnormal; Notable for the following components:      Result Value   Lipase 78 (*)    All other components within normal limits  COMPREHENSIVE METABOLIC PANEL - Abnormal; Notable for the following components:   Glucose, Bld 106 (*)    All other components within normal limits  CBC WITH DIFFERENTIAL/PLATELET - Abnormal; Notable for the following components:   Platelets 120 (*)    All other components within normal limits  URINALYSIS, ROUTINE W REFLEX MICROSCOPIC  TROPONIN I (HIGH SENSITIVITY)  TROPONIN I (HIGH SENSITIVITY)     EKG  None   RADIOLOGY CT ab/pel My interpretation: No free air. Radiology interpretation: IMPRESSION:  1. Thickened pelvic small bowel segment, probably a distal ileal  segment, with adjacent mesenteric stranding. Findings are  nonspecific and could be due to acute enteritis whatever the  etiology, or chronic enteropathy. Ischemic etiology is considered  less likely because there is normal opacification of the SMA and SMV  and no critical aortic stenosis. In the upper abdomen there are  additional findings of gastroenteritis without inflammatory changes.  There  is no small bowel obstruction.  2. Prominent common bile duct but could be normal for a 88 year old.  Unremarkable gallbladder and liver.  3. Constipation and diverticulosis.  4. The appendix obscured distally by overlapping structures but  normal caliber where visible.  5. 1.6 x 1.3 cm partially calcified splenic artery aneurysm.  6. Aortic atherosclerosis.  7. Chronic L3 compression fracture with kyphoplasty. Osteopenia and  degenerative change.  8. Small hiatal hernia with thickened EG junction. Consider  endoscopic follow-up if clinically warranted given age.     PROCEDURES:  Critical Care performed: No  Procedures   MEDICATIONS ORDERED IN ED: Medications - No data to display   IMPRESSION / MDM / [place] / ED COURSE  I reviewed the triage vital signs and the  nursing notes.                              Differential diagnosis includes, but is not limited to, diverticulitis, gastritis, perforated bowel.   Patient presented to the emergency department today from walk in clinic because of concern for abdominal pain and possible diverticulitis. The pain had improved even prior to my evaluation. On my exam abdomin is non tender. Blood work without any leukocytosis. Trop was negative. Lipase was slightly elevated, however no epigastric pain. While blood work was reassuring did order CT abd/pel to evaluate for any significant intraabdominal infection/obstruction. CT did show signs of enteritis and gastroenteritis, no obstruction, no abscess. At this time given improvement of symptoms have low concern for ischemic process. Discussed with patient. At this time patient did feel comfortable going home and given clinical improvement and work up do not feel admission is necessary. Did discuss return precautions with the patient.   FINAL CLINICAL IMPRESSION(S) / ED DIAGNOSES   Final diagnoses:  Abdominal pain, unspecified abdominal location  Gastroenteritis    Note:  This document was prepared using Dragon voice recognition software and may include unintentional dictation errors.    Nance Pear, MD 08/23/21 2148

## 2021-08-23 NOTE — Discharge Instructions (Signed)
As we discussed please return for any worsening pain, bloody vomit or stool, fevers or any other new or concerning symptoms.

## 2021-08-23 NOTE — ED Notes (Signed)
Pt to CT

## 2021-08-23 NOTE — ED Provider Triage Note (Signed)
°  Emergency Medicine Provider Triage Evaluation Note  Destiny Burns , a 88 y.o.female,  was evaluated in triage.  Pt complains of left lower quadrant pain.  Patient states that the pain started earlier today.  Rates it 8/10.  Denies nausea/vomiting/diarrhea.  Reports having dark urine.  She was seen at [facility] clinic today, who suspects that she may have diverticulitis.  Denies chest pain, shortness of breath, back pain, fever, or headache   Review of Systems  Positive: Abdominal pain Negative: Denies fever, chest pain, vomiting  Physical Exam   Vitals:   08/23/21 1733  BP: (!) 160/74  Pulse: 72  Resp: 17  Temp: 98.4 F (36.9 C)  SpO2: 93%   Gen:   Awake, no distress   Resp:  Normal effort  MSK:   Moves extremities without difficulty  Other:    Medical Decision Making  Given the patient's initial medical screening exam, the following diagnostic evaluation has been ordered. The patient will be placed in the appropriate treatment space, once one is available, to complete the evaluation and treatment. I have discussed the plan of care with the patient and I have advised the patient that an ED physician or mid-level practitioner will reevaluate their condition after the test results have been received, as the results may give them additional insight into the type of treatment they may need.    Diagnostics: Labs, abdominal CT  Treatments: none immediately   Teodoro Spray, [place] 08/23/21 1753

## 2021-09-07 DIAGNOSIS — M545 Low back pain, unspecified: Secondary | ICD-10-CM | POA: Diagnosis not present

## 2021-09-07 DIAGNOSIS — R7309 Other abnormal glucose: Secondary | ICD-10-CM | POA: Diagnosis not present

## 2021-09-07 DIAGNOSIS — E039 Hypothyroidism, unspecified: Secondary | ICD-10-CM | POA: Diagnosis not present

## 2021-09-07 DIAGNOSIS — I10 Essential (primary) hypertension: Secondary | ICD-10-CM | POA: Diagnosis not present

## 2021-09-07 DIAGNOSIS — Z79899 Other long term (current) drug therapy: Secondary | ICD-10-CM | POA: Diagnosis not present

## 2021-09-07 DIAGNOSIS — R739 Hyperglycemia, unspecified: Secondary | ICD-10-CM | POA: Diagnosis not present

## 2021-09-15 DIAGNOSIS — G8929 Other chronic pain: Secondary | ICD-10-CM | POA: Diagnosis not present

## 2021-09-15 DIAGNOSIS — I10 Essential (primary) hypertension: Secondary | ICD-10-CM | POA: Diagnosis not present

## 2021-09-15 DIAGNOSIS — F411 Generalized anxiety disorder: Secondary | ICD-10-CM | POA: Diagnosis not present

## 2021-09-15 DIAGNOSIS — M159 Polyosteoarthritis, unspecified: Secondary | ICD-10-CM | POA: Diagnosis not present

## 2021-09-15 DIAGNOSIS — I441 Atrioventricular block, second degree: Secondary | ICD-10-CM | POA: Diagnosis not present

## 2021-09-15 DIAGNOSIS — I480 Paroxysmal atrial fibrillation: Secondary | ICD-10-CM | POA: Diagnosis not present

## 2021-09-15 DIAGNOSIS — M545 Low back pain, unspecified: Secondary | ICD-10-CM | POA: Diagnosis not present

## 2021-10-13 ENCOUNTER — Emergency Department: Payer: Medicare HMO

## 2021-10-13 ENCOUNTER — Observation Stay
Admission: EM | Admit: 2021-10-13 | Discharge: 2021-10-16 | Disposition: A | Discharge status: Home Health | Payer: Medicare HMO | Attending: Internal Medicine | Admitting: Internal Medicine

## 2021-10-13 ENCOUNTER — Other Ambulatory Visit: Payer: Self-pay

## 2021-10-13 DIAGNOSIS — M1990 Unspecified osteoarthritis, unspecified site: Secondary | ICD-10-CM | POA: Insufficient documentation

## 2021-10-13 DIAGNOSIS — Z79899 Other long term (current) drug therapy: Secondary | ICD-10-CM | POA: Diagnosis not present

## 2021-10-13 DIAGNOSIS — R918 Other nonspecific abnormal finding of lung field: Secondary | ICD-10-CM | POA: Diagnosis not present

## 2021-10-13 DIAGNOSIS — Z66 Do not resuscitate: Secondary | ICD-10-CM | POA: Insufficient documentation

## 2021-10-13 DIAGNOSIS — F411 Generalized anxiety disorder: Secondary | ICD-10-CM | POA: Insufficient documentation

## 2021-10-13 DIAGNOSIS — Z20822 Contact with and (suspected) exposure to covid-19: Secondary | ICD-10-CM | POA: Insufficient documentation

## 2021-10-13 DIAGNOSIS — L03116 Cellulitis of left lower limb: Secondary | ICD-10-CM | POA: Diagnosis not present

## 2021-10-13 DIAGNOSIS — I10 Essential (primary) hypertension: Secondary | ICD-10-CM | POA: Diagnosis present

## 2021-10-13 DIAGNOSIS — I4891 Unspecified atrial fibrillation: Secondary | ICD-10-CM | POA: Diagnosis not present

## 2021-10-13 DIAGNOSIS — D649 Anemia, unspecified: Secondary | ICD-10-CM | POA: Diagnosis not present

## 2021-10-13 DIAGNOSIS — R059 Cough, unspecified: Secondary | ICD-10-CM | POA: Diagnosis not present

## 2021-10-13 DIAGNOSIS — Z7982 Long term (current) use of aspirin: Secondary | ICD-10-CM | POA: Diagnosis not present

## 2021-10-13 DIAGNOSIS — D72819 Decreased white blood cell count, unspecified: Secondary | ICD-10-CM | POA: Insufficient documentation

## 2021-10-13 DIAGNOSIS — R159 Full incontinence of feces: Secondary | ICD-10-CM | POA: Insufficient documentation

## 2021-10-13 DIAGNOSIS — R9431 Abnormal electrocardiogram [ECG] [EKG]: Secondary | ICD-10-CM | POA: Insufficient documentation

## 2021-10-13 DIAGNOSIS — I5021 Acute systolic (congestive) heart failure: Secondary | ICD-10-CM | POA: Insufficient documentation

## 2021-10-13 DIAGNOSIS — I491 Atrial premature depolarization: Secondary | ICD-10-CM | POA: Diagnosis not present

## 2021-10-13 DIAGNOSIS — D696 Thrombocytopenia, unspecified: Secondary | ICD-10-CM | POA: Diagnosis present

## 2021-10-13 DIAGNOSIS — I482 Chronic atrial fibrillation, unspecified: Secondary | ICD-10-CM | POA: Diagnosis present

## 2021-10-13 DIAGNOSIS — E876 Hypokalemia: Secondary | ICD-10-CM | POA: Insufficient documentation

## 2021-10-13 DIAGNOSIS — M7989 Other specified soft tissue disorders: Secondary | ICD-10-CM | POA: Diagnosis present

## 2021-10-13 DIAGNOSIS — J90 Pleural effusion, not elsewhere classified: Secondary | ICD-10-CM | POA: Diagnosis not present

## 2021-10-13 DIAGNOSIS — L03119 Cellulitis of unspecified part of limb: Secondary | ICD-10-CM | POA: Diagnosis present

## 2021-10-13 DIAGNOSIS — M40204 Unspecified kyphosis, thoracic region: Secondary | ICD-10-CM | POA: Insufficient documentation

## 2021-10-13 DIAGNOSIS — R600 Localized edema: Secondary | ICD-10-CM | POA: Diagnosis not present

## 2021-10-13 DIAGNOSIS — Z87440 Personal history of urinary (tract) infections: Secondary | ICD-10-CM | POA: Insufficient documentation

## 2021-10-13 DIAGNOSIS — I110 Hypertensive heart disease with heart failure: Secondary | ICD-10-CM | POA: Diagnosis not present

## 2021-10-13 DIAGNOSIS — M2550 Pain in unspecified joint: Secondary | ICD-10-CM | POA: Insufficient documentation

## 2021-10-13 DIAGNOSIS — R2243 Localized swelling, mass and lump, lower limb, bilateral: Secondary | ICD-10-CM | POA: Diagnosis present

## 2021-10-13 DIAGNOSIS — M79662 Pain in left lower leg: Secondary | ICD-10-CM | POA: Diagnosis not present

## 2021-10-13 DIAGNOSIS — R946 Abnormal results of thyroid function studies: Secondary | ICD-10-CM | POA: Insufficient documentation

## 2021-10-13 DIAGNOSIS — E039 Hypothyroidism, unspecified: Secondary | ICD-10-CM | POA: Diagnosis present

## 2021-10-13 DIAGNOSIS — R21 Rash and other nonspecific skin eruption: Secondary | ICD-10-CM | POA: Diagnosis not present

## 2021-10-13 DIAGNOSIS — Z79890 Hormone replacement therapy (postmenopausal): Secondary | ICD-10-CM | POA: Insufficient documentation

## 2021-10-13 DIAGNOSIS — Z859 Personal history of malignant neoplasm, unspecified: Secondary | ICD-10-CM | POA: Insufficient documentation

## 2021-10-13 DIAGNOSIS — M4854XA Collapsed vertebra, not elsewhere classified, thoracic region, initial encounter for fracture: Secondary | ICD-10-CM | POA: Diagnosis not present

## 2021-10-13 DIAGNOSIS — N1831 Chronic kidney disease, stage 3a: Secondary | ICD-10-CM | POA: Diagnosis not present

## 2021-10-13 DIAGNOSIS — L03115 Cellulitis of right lower limb: Secondary | ICD-10-CM | POA: Diagnosis not present

## 2021-10-13 DIAGNOSIS — I480 Paroxysmal atrial fibrillation: Secondary | ICD-10-CM | POA: Diagnosis not present

## 2021-10-13 DIAGNOSIS — I083 Combined rheumatic disorders of mitral, aortic and tricuspid valves: Secondary | ICD-10-CM | POA: Insufficient documentation

## 2021-10-13 DIAGNOSIS — Z880 Allergy status to penicillin: Secondary | ICD-10-CM | POA: Insufficient documentation

## 2021-10-13 DIAGNOSIS — M720 Palmar fascial fibromatosis [Dupuytren]: Secondary | ICD-10-CM | POA: Insufficient documentation

## 2021-10-13 DIAGNOSIS — J189 Pneumonia, unspecified organism: Secondary | ICD-10-CM | POA: Insufficient documentation

## 2021-10-13 DIAGNOSIS — M79661 Pain in right lower leg: Secondary | ICD-10-CM | POA: Diagnosis not present

## 2021-10-13 DIAGNOSIS — I441 Atrioventricular block, second degree: Secondary | ICD-10-CM | POA: Insufficient documentation

## 2021-10-13 DIAGNOSIS — R7989 Other specified abnormal findings of blood chemistry: Secondary | ICD-10-CM | POA: Insufficient documentation

## 2021-10-13 DIAGNOSIS — K219 Gastro-esophageal reflux disease without esophagitis: Secondary | ICD-10-CM | POA: Insufficient documentation

## 2021-10-13 DIAGNOSIS — Z8249 Family history of ischemic heart disease and other diseases of the circulatory system: Secondary | ICD-10-CM | POA: Insufficient documentation

## 2021-10-13 DIAGNOSIS — M6281 Muscle weakness (generalized): Secondary | ICD-10-CM | POA: Insufficient documentation

## 2021-10-13 DIAGNOSIS — R079 Chest pain, unspecified: Secondary | ICD-10-CM | POA: Diagnosis not present

## 2021-10-13 LAB — CBC
HCT: 36.6 % (ref 36.0–46.0)
Hemoglobin: 11.6 g/dL — ABNORMAL LOW (ref 12.0–15.0)
MCH: 29.7 pg (ref 26.0–34.0)
MCHC: 31.7 g/dL (ref 30.0–36.0)
MCV: 93.6 fL (ref 80.0–100.0)
Platelets: 116 10*3/uL — ABNORMAL LOW (ref 150–400)
RBC: 3.91 MIL/uL (ref 3.87–5.11)
RDW: 13.2 % (ref 11.5–15.5)
WBC: 3.2 10*3/uL — ABNORMAL LOW (ref 4.0–10.5)
nRBC: 0 % (ref 0.0–0.2)

## 2021-10-13 LAB — BASIC METABOLIC PANEL
Anion gap: 7 (ref 5–15)
BUN: 27 mg/dL — ABNORMAL HIGH (ref 8–23)
CO2: 28 mmol/L (ref 22–32)
Calcium: 9.4 mg/dL (ref 8.9–10.3)
Chloride: 103 mmol/L (ref 98–111)
Creatinine, Ser: 0.91 mg/dL (ref 0.44–1.00)
GFR, Estimated: 59 mL/min — ABNORMAL LOW (ref >60)
Glucose, Bld: 142 mg/dL — ABNORMAL HIGH (ref 70–99)
Potassium: 3.3 mmol/L — ABNORMAL LOW (ref 3.5–5.1)
Sodium: 138 mmol/L (ref 135–145)

## 2021-10-13 LAB — TROPONIN I (HIGH SENSITIVITY)
Troponin I (High Sensitivity): 10 ng/L (ref <18)
Troponin I (High Sensitivity): 15 ng/L (ref <18)

## 2021-10-13 LAB — BRAIN NATRIURETIC PEPTIDE: B Natriuretic Peptide: 225.5 pg/mL — ABNORMAL HIGH (ref 0.0–100.0)

## 2021-10-13 MED ORDER — SODIUM CHLORIDE 0.9 % IV SOLN
100.0000 mg | Freq: Once | INTRAVENOUS | Status: AC
  Administered 2021-10-14: 100 mg via INTRAVENOUS
  Filled 2021-10-13: qty 100

## 2021-10-13 MED ORDER — ONDANSETRON HCL 4 MG/2ML IJ SOLN: 4.0000 mg | Freq: Once | INTRAMUSCULAR | Status: DC

## 2021-10-13 MED ORDER — SODIUM CHLORIDE 0.9 % IV SOLN: 2.0000 g | Freq: Once | INTRAVENOUS | Status: DC

## 2021-10-13 NOTE — ED Triage Notes (Signed)
Patient to the ER from home with complaints of bilateral leg swelling x 2 days. Bilateral lower extremities warm and red on assessment. Patient denies chest pain or shortness of breath. Denies fevers at home. Reports hx of afib.  ?

## 2021-10-13 NOTE — ED Triage Notes (Signed)
First Nurse Note:  Arrives from [facility] for evaluation of bilateral leg swelling.  Per report, patient is in Afib.  Has history of Afib.  Heart rate unknown at time of report. ?

## 2021-10-13 NOTE — ED Provider Notes (Signed)
[facility] Provider Note    Event Date/Time   First MD Initiated Contact with Patient 10/13/21 2312     (approximate)   History   Leg Swelling   HPI  Destiny Burns is a 88 y.o. female with history of atrial fibrillation, hypertension, hypothyroidism, second-degree AV block who presents to the emergency department with several days of leg swelling and now redness and warmth.  She denies any known fevers or chills.  No previous history of PE or DVT.  She has never had cellulitis before.  No injury to her legs.  She is able to ambulate.  She also reports several days of a cough but no chest pain or shortness of breath.  No known sick contacts.  She states she just assumed her cough was from allergies.   History provided by patient and brother at bedside.    Past Medical History:  Diagnosis Date   Abnormal glucose    Anemia    as a child   Arthritis    Atrial fibrillation ([place])    Cancer (HCC)    basal cell   Dupuytren contracture    GAD (generalized anxiety disorder)    GERD (gastroesophageal reflux disease)    no meds   Hypertension    Hypothyroidism    Mobitz type 2 second degree atrioventricular block    Pedal edema    SOB (shortness of breath) on exertion    Thyroid disease     Past Surgical History:  Procedure Laterality Date   ABDOMINAL HYSTERECTOMY     BREAST BIOPSY Left 11/08/15   neg   BREAST BIOPSY Right    neg   CATARACT EXTRACTION W/PHACO Left 07/29/2020   Procedure: CATARACT EXTRACTION PHACO AND INTRAOCULAR LENS PLACEMENT (IOC) LEFT 11.43 01;13.5 15.6%;  Surgeon: Leandrew Koyanagi, MD;  Location: [place];  Service: Ophthalmology;  Laterality: Left;   CATARACT EXTRACTION W/PHACO Right 12/07/2020   Procedure: CATARACT EXTRACTION PHACO AND INTRAOCULAR LENS PLACEMENT (IOC) RIGHT 20.79 02:27.5 14.1%;  Surgeon: Leandrew Koyanagi, MD;  Location: [place];  Service: Ophthalmology;  Laterality: Right;    DILATION AND CURETTAGE OF UTERUS     HAND SURGERY     KYPHOPLASTY N/A 05/31/2015   Procedure: KYPHOPLASTY L3;  Surgeon: Hessie Knows, MD;  Location: [facility];  Service: Orthopedics;  Laterality: N/A;   TONSILLECTOMY      MEDICATIONS:  Prior to Admission medications   Medication Sig Start Date End Date Taking? Authorizing Provider  acetaminophen (TYLENOL) 325 MG tablet Take 650 mg by mouth every 6 (six) hours as needed. Reported on 01/24/2016    [provider]  aspirin 81 MG tablet Take 81 mg by mouth daily.    [provider]  Calcium Carb-Cholecalciferol (CALCIUM-VITAMIN D) 500-400 MG-UNIT TABS Take by mouth.    [provider]  cholecalciferol (VITAMIN D) 1000 UNITS tablet Take 1,000 Units by mouth daily.    [provider]  diclofenac sodium (VOLTAREN) 1 % GEL Apply 2 g topically 4 (four) times daily. 04/03/18   Laban Emperor, PA-C  furosemide (LASIX) 20 MG tablet  05/01/19   [provider]  hydrochlorothiazide (HYDRODIURIL) 25 MG tablet Take 1 tablet (25 mg total) by mouth daily. 07/21/15   Jackolyn Confer, MD  isosorbide mononitrate (IMDUR) 30 MG 24 hr tablet  05/01/19   [provider]  levothyroxine (SYNTHROID, LEVOTHROID) 25 MCG tablet Take 1 tablet (25 mcg total) by mouth daily. 07/21/15   Ronette Deter  A, MD  lidocaine (LIDODERM) 5 % Place 1 patch onto the skin daily. Remove & Discard patch within 12 hours or as directed by MD 04/03/18   Laban Emperor, PA-C  loratadine (CLARITIN) 10 MG tablet Take 10 mg by mouth.    [provider]  LORazepam (ATIVAN) 0.5 MG tablet  05/15/19   [provider]  mirabegron ER (MYRBETRIQ) 25 MG TB24 tablet Take 25 mg by mouth daily. Reported on 01/24/2016    [provider]  mupirocin ointment (BACTROBAN) 2 % Apply topically. 02/29/16   [provider]  nystatin (MYCOSTATIN/NYSTOP) powder APPLY TO AFFECTED AREA TWICE A DAY 10/20/18   [provider]     Physical Exam   Triage Vital Signs: ED Triage Vitals [10/13/21 1806]  Enc Vitals Group     BP (!) 141/64     Pulse Rate 77     Resp 18     Temp 98 F (36.7 C)     Temp Source Oral     SpO2 96 %     Weight 150 lb (68 kg)     Height 5\' 1"  (1.549 m)     Head Circumference      Peak Flow      Pain Score 0     Pain Loc      Pain Edu?      Excl. in [place]?     Most recent vital signs: Vitals:   10/13/21 2205 10/13/21 2348  BP: (!) 164/66 (!) 163/83  Pulse: (!) 127 85  Resp: 16 17  Temp: 97.9 F (36.6 C)   SpO2: 90% 97%    CONSTITUTIONAL: Alert and oriented and responds appropriately to questions. Well-appearing; well-nourished, elderly, nontoxic HEAD: Normocephalic, atraumatic EYES: Conjunctivae clear, pupils appear equal, sclera nonicteric ENT: normal nose; moist mucous membranes NECK: Supple, normal ROM CARD: Irregularly irregular and currently rate controlled; S1 and S2 appreciated; no murmurs, no clicks, no rubs, no gallops RESP: Normal chest excursion without splinting or tachypnea; breath sounds clear and equal bilaterally; no wheezes, no rhonchi, no rales, no hypoxia or respiratory distress, speaking full sentences ABD/GI: Normal bowel sounds; non-distended; soft, non-tender, no rebound, no guarding, no peritoneal signs BACK: The back appears normal EXT: Pitting edema in bilateral lower extremities with associated redness and warmth to the distal shins.  2+ DP pulses bilaterally.  Compartments in the legs are soft.  No calf tenderness or calf swelling but she is quite tender to palpation over the anterior portion of both of her legs.  No joint effusions noted.  Normal capillary refill. SKIN: Normal color for age and race; warm; no rash on exposed skin NEURO: Moves all extremities equally, normal speech PSYCH: The patient's mood and manner are appropriate.       Patient gave verbal permission to utilize photo for medical documentation only. The image was not  stored on any personal device.  ED Results / Procedures / Treatments   LABS: (all labs ordered are listed, but only abnormal results are displayed) Labs Reviewed  BASIC METABOLIC PANEL - Abnormal; Notable for the following components:      Result Value   Potassium 3.3 (*)    Glucose, Bld 142 (*)    BUN 27 (*)    GFR, Estimated 59 (*)    All other components within normal limits  CBC - Abnormal; Notable for the following components:   WBC 3.2 (*)    Hemoglobin 11.6 (*)    Platelets 116 (*)  All other components within normal limits  BRAIN NATRIURETIC PEPTIDE - Abnormal; Notable for the following components:   B Natriuretic Peptide 225.5 (*)    All other components within normal limits  RESP PANEL BY RT-PCR (FLU A&B, COVID) ARPGX2  CULTURE, BLOOD (ROUTINE X 2)  CULTURE, BLOOD (ROUTINE X 2)  LACTIC ACID, PLASMA  PROCALCITONIN  TROPONIN I (HIGH SENSITIVITY)  TROPONIN I (HIGH SENSITIVITY)     EKG:  EKG Interpretation  Date/Time:  Friday October 13 2021 18:14:56 EST Ventricular Rate:  76 PR Interval:    QRS Duration: 78 QT Interval:  406 QTC Calculation: 456 R Axis:   105 Text Interpretation: Atrial fibrillation Rightward axis Low voltage QRS Abnormal ECG When compared with ECG of 31-May-2015 15:51, Atrial fibrillation has replaced Sinus rhythm QRS axis Shifted right Confirmed by Pryor Curia [phone]) on 10/13/2021 11:46:31 PM         RADIOLOGY: My personal review and interpretation of imaging: Chest x-ray concerning for bibasilar pneumonia.  Bilateral lower extremity Doppler showed no DVT.  I have personally reviewed all radiology reports.   DG Chest 2 View  Result Date: 10/13/2021 CLINICAL DATA:  Chest pain.  Bilateral leg swelling. EXAM: CHEST - 2 VIEW COMPARISON:  Remote radiograph and CT 12/12/2007 FINDINGS: The heart is normal in size. Atherosclerosis of the thoracic aorta with mild tortuosity. There is biapical pleuroparenchymal scarring. Ill-defined patchy  opacity at both lung bases. No pulmonary edema. There may be a small left pleural effusion. The bones are under mineralized. T12 compression fracture is age indeterminate, but not definitively seen on 08/23/2021 abdominal CT. L3 compression deformity with vertebral augmentation of chronic IMPRESSION: 1. Ill-defined patchy opacity at both lung bases, may be atelectasis or pneumonia. Possible small left pleural effusion. 2. T12 compression fracture, age indeterminate but possibly acute/subacute. Electronically Signed   By: Keith Rake M.D.   On: 10/13/2021 18:34   US Venous Img Lower Bilateral (DVT)  Result Date: 10/13/2021 CLINICAL DATA:  Bilateral lower extremity swelling x2 days. EXAM: BILATERAL LOWER EXTREMITY VENOUS DOPPLER ULTRASOUND TECHNIQUE: Gray-scale sonography with graded compression, as well as color Doppler and duplex ultrasound were performed to evaluate the lower extremity deep venous systems from the level of the common femoral vein and including the common femoral, femoral, profunda femoral, popliteal and calf veins including the posterior tibial, peroneal and gastrocnemius veins when visible. The superficial great saphenous vein was also interrogated. Spectral Doppler was utilized to evaluate flow at rest and with distal augmentation maneuvers in the common femoral, femoral and popliteal veins. COMPARISON:  None. FINDINGS: RIGHT LOWER EXTREMITY Common Femoral Vein: No evidence of thrombus. Normal compressibility, respiratory phasicity and response to augmentation. Saphenofemoral Junction: No evidence of thrombus. Normal compressibility and flow on color Doppler imaging. Profunda Femoral Vein: No evidence of thrombus. Normal compressibility and flow on color Doppler imaging. Femoral Vein: No evidence of thrombus. Normal compressibility, respiratory phasicity and response to augmentation. Popliteal Vein: No evidence of thrombus. Normal compressibility, respiratory phasicity and response to  augmentation. Calf Veins: No evidence of thrombus. Normal compressibility and flow on color Doppler imaging. Superficial Great Saphenous Vein: No evidence of thrombus. Normal compressibility. Venous Reflux:  None. Other Findings:  None. LEFT LOWER EXTREMITY Common Femoral Vein: No evidence of thrombus. Normal compressibility, respiratory phasicity and response to augmentation. Saphenofemoral Junction: No evidence of thrombus. Normal compressibility and flow on color Doppler imaging. Profunda Femoral Vein: No evidence of thrombus. Normal compressibility and flow on color Doppler imaging. Femoral Vein: No evidence  of thrombus. Normal compressibility, respiratory phasicity and response to augmentation. Popliteal Vein: No evidence of thrombus. Normal compressibility, respiratory phasicity and response to augmentation. Calf Veins: No evidence of thrombus. Normal compressibility and flow on color Doppler imaging. Superficial Great Saphenous Vein: No evidence of thrombus. Normal compressibility. Venous Reflux:  None. Other Findings:  None. IMPRESSION: No evidence of deep venous thrombosis in either lower extremity. Electronically Signed   By: Virgina Norfolk M.D.   On: 10/13/2021 19:02     PROCEDURES:  Critical Care performed: No   CRITICAL CARE Performed by: Cyril Mourning Hortencia Martire   Total critical care time: 0 minutes  Critical care time was exclusive of separately billable procedures and treating other patients.  Critical care was necessary to treat or prevent imminent or life-threatening deterioration.  Critical care was time spent personally by me on the following activities: development of treatment plan with patient and/or surrogate as well as nursing, discussions with consultants, evaluation of patient's response to treatment, examination of patient, obtaining history from patient or surrogate, ordering and performing treatments and interventions, ordering and review of laboratory studies, ordering and  review of radiographic studies, pulse oximetry and re-evaluation of patient's condition.   Marland Kitchen1-3 Lead EKG Interpretation Performed by: Iver Fehrenbach, Delice Bison, DO Authorized by: Candance Bohlman, Delice Bison, DO     Interpretation: abnormal     ECG rate:  87   ECG rate assessment: normal     Rhythm: atrial fibrillation     Ectopy: none     Conduction: normal      IMPRESSION / MDM / ASSESSMENT AND PLAN / ED COURSE  I reviewed the triage vital signs and the nursing notes.    Patient here with complaints of increasing lower extremity swelling, redness and warmth and also a cough.  The patient is on the cardiac monitor to evaluate for evidence of arrhythmia and/or significant heart rate changes.   DIFFERENTIAL DIAGNOSIS (includes but not limited to):   Cellulitis, DVT, pneumonia, COVID-19, influenza, seasonal allergies, CHF.  No sign of compartment syndrome, gout, septic arthritis, arterial obstruction.   PLAN: We will obtain CBC, BMP, venous Dopplers of bilateral lower extremities, chest x-ray, COVID and flu swabs, blood cultures, lactate, procalcitonin.  She declines anything for pain.   MEDICATIONS GIVEN IN ED: Medications  doxycycline (VIBRAMYCIN) 100 mg in sodium chloride 0.9 % 250 mL IVPB (has no administration in time range)  ondansetron (ZOFRAN) injection 4 mg (has no administration in time range)     ED COURSE: Labs show a mild leukopenia.  She also has thrombocytopenia which is stable compared to previous.  Renal function normal.  Mildly low potassium of 3.3.  Will replace.  Troponin x2 negative.  BNP 225.  EKG shows A-fib with no new ischemic changes.  She was tachycardic in the waiting room but is now rate controlled.  We will continue to closely monitor this on cardiac monitoring.  Venous Dopplers reviewed by myself and radiologist show no DVT.  Chest x-ray reviewed by myself and radiologist shows atelectasis versus pneumonia.  It looks worse on the left side and there is a small left  pleural effusion.  Given her cough, I think that this could possibly be pneumonia.  Will cover with doxycycline for her cellulitis as well as community-acquired pneumonia and have recommended admission.  She is not hypoxic currently.  Patient is comfortable with this plan as is her brother at the bedside.   CONSULTS:  Consulted and discussed patient's case with hospitalist, Dr. Posey Pronto.  I have recommended admission and consulting physician agrees and will place admission orders.  Patient (and family if present) agree with this plan.   I reviewed all nursing notes, vitals, pertinent previous records.  All labs, EKGs, imaging ordered have been independently reviewed and interpreted by myself.    OUTSIDE RECORDS REVIEWED: Reviewed patient's last office visit with Fulton Reek on 09/07/2021.         FINAL CLINICAL IMPRESSION(S) / ED DIAGNOSES   Final diagnoses:  Leg swelling  Cellulitis of right lower extremity  Cellulitis of left lower extremity  Community acquired pneumonia of right lower lobe of lung  Community acquired pneumonia of left lower lobe of lung     Rx / DC Orders   ED Discharge Orders     None        Note:  This document was prepared using Dragon voice recognition software and may include unintentional dictation errors.   Xiana Carns, Delice Bison, DO 10/14/21 0004

## 2021-10-14 ENCOUNTER — Encounter: Payer: Self-pay | Admitting: Internal Medicine

## 2021-10-14 ENCOUNTER — Observation Stay (HOSPITAL_BASED_OUTPATIENT_CLINIC_OR_DEPARTMENT_OTHER)
Admit: 2021-10-14 | Discharge: 2021-10-14 | Disposition: A | Discharge status: Home / Self Care | Payer: Medicare HMO | Attending: Internal Medicine | Admitting: Internal Medicine

## 2021-10-14 DIAGNOSIS — M7989 Other specified soft tissue disorders: Secondary | ICD-10-CM

## 2021-10-14 DIAGNOSIS — D696 Thrombocytopenia, unspecified: Secondary | ICD-10-CM | POA: Diagnosis present

## 2021-10-14 DIAGNOSIS — I5021 Acute systolic (congestive) heart failure: Secondary | ICD-10-CM

## 2021-10-14 DIAGNOSIS — J90 Pleural effusion, not elsewhere classified: Secondary | ICD-10-CM | POA: Diagnosis present

## 2021-10-14 DIAGNOSIS — L03119 Cellulitis of unspecified part of limb: Secondary | ICD-10-CM | POA: Diagnosis present

## 2021-10-14 DIAGNOSIS — D649 Anemia, unspecified: Secondary | ICD-10-CM | POA: Diagnosis present

## 2021-10-14 DIAGNOSIS — I482 Chronic atrial fibrillation, unspecified: Secondary | ICD-10-CM | POA: Diagnosis present

## 2021-10-14 LAB — RESP PANEL BY RT-PCR (FLU A&B, COVID) ARPGX2
Influenza A by PCR: NEGATIVE
Influenza B by PCR: NEGATIVE
SARS Coronavirus 2 by RT PCR: NEGATIVE

## 2021-10-14 LAB — CBC
HCT: 37.7 % (ref 36.0–46.0)
Hemoglobin: 12 g/dL (ref 12.0–15.0)
MCH: 29.2 pg (ref 26.0–34.0)
MCHC: 31.8 g/dL (ref 30.0–36.0)
MCV: 91.7 fL (ref 80.0–100.0)
Platelets: 131 10*3/uL — ABNORMAL LOW (ref 150–400)
RBC: 4.11 MIL/uL (ref 3.87–5.11)
RDW: 13.2 % (ref 11.5–15.5)
WBC: 4 10*3/uL (ref 4.0–10.5)
nRBC: 0 % (ref 0.0–0.2)

## 2021-10-14 LAB — ECHOCARDIOGRAM COMPLETE
AR max vel: 2.59 cm2
AV Peak grad: 5 mmHg
Ao pk vel: 1.12 m/s
Area-P 1/2: 5.58 cm2
Height: 61 in
S' Lateral: 3.12 cm
Weight: 2400 oz

## 2021-10-14 LAB — PROCALCITONIN: Procalcitonin: <0.1 ng/mL

## 2021-10-14 LAB — TSH: TSH: 4.644 u[IU]/mL — ABNORMAL HIGH (ref 0.350–4.500)

## 2021-10-14 LAB — MAGNESIUM: Magnesium: 1.8 mg/dL (ref 1.7–2.4)

## 2021-10-14 LAB — BASIC METABOLIC PANEL
Anion gap: 6 (ref 5–15)
BUN: 22 mg/dL (ref 8–23)
CO2: 28 mmol/L (ref 22–32)
Calcium: 9.5 mg/dL (ref 8.9–10.3)
Chloride: 104 mmol/L (ref 98–111)
Creatinine, Ser: 0.79 mg/dL (ref 0.44–1.00)
GFR, Estimated: >60 mL/min (ref >60)
Glucose, Bld: 106 mg/dL — ABNORMAL HIGH (ref 70–99)
Potassium: 3.5 mmol/L (ref 3.5–5.1)
Sodium: 138 mmol/L (ref 135–145)

## 2021-10-14 LAB — VITAMIN B12: Vitamin B-12: 316 pg/mL (ref 180–914)

## 2021-10-14 LAB — T4, FREE: Free T4: 1.43 ng/dL — ABNORMAL HIGH (ref 0.61–1.12)

## 2021-10-14 LAB — LACTIC ACID, PLASMA: Lactic Acid, Venous: 1 mmol/L (ref 0.5–1.9)

## 2021-10-14 MED ORDER — ACETAMINOPHEN 325 MG PO TABS: 650.0000 mg | ORAL_TABLET | Freq: Four times a day (QID) | ORAL | Status: DC | PRN

## 2021-10-14 MED ORDER — FUROSEMIDE 10 MG/ML IJ SOLN
20.0000 mg | Freq: Two times a day (BID) | INTRAMUSCULAR | Status: DC
  Administered 2021-10-14 (×2): 20 mg via INTRAVENOUS
  Filled 2021-10-14 (×2): qty 4

## 2021-10-14 MED ORDER — FUROSEMIDE 10 MG/ML IJ SOLN
20.0000 mg | Freq: Two times a day (BID) | INTRAMUSCULAR | Status: DC
  Administered 2021-10-14 – 2021-10-16 (×4): 20 mg via INTRAVENOUS
  Filled 2021-10-14 (×4): qty 2

## 2021-10-14 MED ORDER — HEPARIN SODIUM (PORCINE) 5000 UNIT/ML IJ SOLN
5000.0000 [IU] | Freq: Three times a day (TID) | INTRAMUSCULAR | Status: DC
  Administered 2021-10-14 – 2021-10-16 (×7): 5000 [IU] via SUBCUTANEOUS
  Filled 2021-10-14 (×7): qty 1

## 2021-10-14 MED ORDER — ONDANSETRON HCL 4 MG/2ML IJ SOLN: 4.0000 mg | Freq: Four times a day (QID) | INTRAMUSCULAR | Status: DC | PRN

## 2021-10-14 MED ORDER — POLYETHYLENE GLYCOL 3350 17 G PO PACK: 17.0000 g | PACK | Freq: Every day | ORAL | Status: DC | PRN

## 2021-10-14 MED ORDER — ASPIRIN EC 81 MG PO TBEC
81.0000 mg | DELAYED_RELEASE_TABLET | Freq: Every day | ORAL | Status: DC
  Administered 2021-10-14 – 2021-10-16 (×3): 81 mg via ORAL
  Filled 2021-10-14 (×3): qty 1

## 2021-10-14 MED ORDER — SODIUM CHLORIDE 0.9% FLUSH
3.0000 mL | Freq: Two times a day (BID) | INTRAVENOUS | Status: DC
  Administered 2021-10-14 – 2021-10-15 (×5): 3 mL via INTRAVENOUS

## 2021-10-14 MED ORDER — LEVOTHYROXINE SODIUM 25 MCG PO TABS
25.0000 ug | ORAL_TABLET | Freq: Every day | ORAL | Status: DC
  Administered 2021-10-14 – 2021-10-16 (×3): 25 ug via ORAL
  Filled 2021-10-14 (×3): qty 1

## 2021-10-14 MED ORDER — DOCUSATE SODIUM 100 MG PO CAPS
100.0000 mg | ORAL_CAPSULE | Freq: Two times a day (BID) | ORAL | Status: DC
  Administered 2021-10-14 (×2): 100 mg via ORAL
  Filled 2021-10-14 (×3): qty 1

## 2021-10-14 MED ORDER — ACETAMINOPHEN 650 MG RE SUPP: 650.0000 mg | Freq: Four times a day (QID) | RECTAL | Status: DC | PRN

## 2021-10-14 MED ORDER — ONDANSETRON HCL 4 MG PO TABS: 4.0000 mg | ORAL_TABLET | Freq: Four times a day (QID) | ORAL | Status: DC | PRN

## 2021-10-14 MED ORDER — BISACODYL 5 MG PO TBEC: 5.0000 mg | DELAYED_RELEASE_TABLET | Freq: Every day | ORAL | Status: DC | PRN

## 2021-10-14 MED ORDER — DOXYCYCLINE HYCLATE 100 MG PO TABS
100.0000 mg | ORAL_TABLET | Freq: Two times a day (BID) | ORAL | Status: DC
  Administered 2021-10-14 – 2021-10-15 (×3): 100 mg via ORAL
  Filled 2021-10-14 (×3): qty 1

## 2021-10-14 MED ORDER — POTASSIUM CHLORIDE 10 MEQ/100ML IV SOLN
10.0000 meq | INTRAVENOUS | Status: AC
  Administered 2021-10-14 (×3): 10 meq via INTRAVENOUS
  Filled 2021-10-14 (×2): qty 100

## 2021-10-14 MED ORDER — CEFAZOLIN SODIUM-DEXTROSE 1-4 GM/50ML-% IV SOLN
1.0000 g | Freq: Three times a day (TID) | INTRAVENOUS | Status: DC
  Filled 2021-10-14: qty 50

## 2021-10-14 MED ORDER — MIRABEGRON ER 25 MG PO TB24
25.0000 mg | ORAL_TABLET | Freq: Every day | ORAL | Status: DC
  Administered 2021-10-14 – 2021-10-16 (×3): 25 mg via ORAL
  Filled 2021-10-14 (×3): qty 1

## 2021-10-14 NOTE — Hospital Course (Addendum)
88 year old female with past medical history of hypertension, A-fib, hypothyroidism, anemia, history of secondary AV block [facility] with several days of bilateral lower leg swelling has been worsening and not getting red.  She also complains of cough but no chest pain or shortness of breath.  No known sick contacts.  She assumed cough is due to her allergies. ?In the ED, vitals stable afebrile heart rate at 127 but mostly in 70s, labs with mild leukopenia 3.2K, thrombocytopenia 116, hypokalemia 3.3 lactic acid normal troponin negative x210, 15, procalcitonin negative, EKG A-fib heart rate 76, chest x-ray ill-defined patchy opacity at both lung bases may be atelectasis or pneumonia possible small left pleural effusion, T12 compression fracture age indeterminate but possibly acute/subacute, duplex of lower extremities no DVT.  Patient was given doxycycline x1 and admitted for further management. ? ?Lower extremity cellulitis was ruled out.  No need for any antibiotics at this time.  Lower extremity edema improved with IV Lasix.  Most likely secondary to chronic venous stasis.  Echocardiogram was without any significant abnormality. ? ?She was asked to continue taking Lasix 20 mg daily, home HCTZ was discontinued. ? ?She also developed mild hypokalemia secondary to diuretic.  Potassium was replaced before discharge and she will need monitoring of her BMP by PCP for further management.. ? ?She will continue with the rest of her home medications and follow-up with her providers for further recommendations. ?

## 2021-10-14 NOTE — Assessment & Plan Note (Signed)
Bilateral pedal edema with an elevated BNP and small pleural effusion I suspect patient has congestive heart failure.  Chart review does not show an echo.  We will obtain an echo and start patient on as needed diuretic therapy with low dose Lasix. ?

## 2021-10-14 NOTE — Assessment & Plan Note (Addendum)
Patient started on doxycycline we will continue and elevate both legs and skin care. ?Discontinue doxycycline if procalcitonin is negative.  I have continued the vancomycin, rocephin and doxycycline , however I do not suspect an infectious source and will d/c in 24 hours if cultures are negative.  ?

## 2021-10-14 NOTE — Assessment & Plan Note (Signed)
As above. ?Patient O2 sats are within normal limits. ?Supplemental oxygen on worsening. ?Consistent diuretic therapy resolves. ?We will consider CT angio if concern for pulmonary embolism. ?

## 2021-10-14 NOTE — Assessment & Plan Note (Signed)
Patient currently needs atrial fibrillation. ?We will continue patient on Imdur along with aspirin 81. ?

## 2021-10-14 NOTE — Assessment & Plan Note (Signed)
Thrombocytopenia of 116 today. ?Chart review shows patient has been having thrombocytopenia since the prior past 4 CBCs as below. ?CBC 10/13/2021 08/23/2021 07/09/2011  ?WBC 3.2(L) 4.6 3.9(L)  ?Hemoglobin 11.6(L) 12.4 12.0  ?Hematocrit 36.6 37.1 35.6(L)  ?Platelets 116(L) 120(L) 117.0(L)  ? ? ?

## 2021-10-14 NOTE — ED Notes (Signed)
Pt assisted to toilet in room. Pt required 1+ assist to get to toilet.   ?

## 2021-10-14 NOTE — Progress Notes (Signed)
PHARMACY CONSULT NOTE - FOLLOW UP ? ?Pharmacy Consult for Electrolyte Monitoring and Replacement  ? ?Recent Labs: ?Potassium (mmol/L)  ?Date Value  ?10/13/2021 3.3 (L)  ? ?Calcium (mg/dL)  ?Date Value  ?10/13/2021 9.4  ? ?Albumin (g/dL)  ?Date Value  ?08/23/2021 4.0  ? ?Sodium (mmol/L)  ?Date Value  ?10/13/2021 138  ? ? ? ?Assessment: ?3/3:  K @ 1813 = 3.3 ? ?Goal of Therapy:  ?Electrolytes WNL  ? ?Plan:  ?Will order KCl 10 mEq IV X 3 to start 3/4 @ 0300. ?Will recheck electrolytes on 3/4 @ 0700.  ? ?Sherryann Frese D ,PharmD ?Clinical Pharmacist ?10/14/2021 2:24 AM ? ?

## 2021-10-14 NOTE — Assessment & Plan Note (Signed)
We can check patient's free T4 and TSH and titrate levothyroxine as deemed appropriate. ? ?

## 2021-10-14 NOTE — Assessment & Plan Note (Signed)
Blood pressure (!) 163/83, pulse 85, temperature 97.9 ?F (36.6 ?C), temperature source Oral, resp. rate 17, height '5\' 1"'$  (1.549 m), weight 68 kg, SpO2 97 %. ?Will continue patient on her regimen of HCTZ, Imdur 30, as needed Lasix. ?Of note patient is already on Lasix 20 mg on her home regimen. ? ?

## 2021-10-14 NOTE — ED Notes (Signed)
Rn to bedside to introduce self to pt. Pt CAOx4 and in no distress. Purewick was not in place. Pt asked for it to be placed.  ?

## 2021-10-14 NOTE — Progress Notes (Signed)
Patient seen and examined personally, I reviewed the chart, history and physical and admission note, done by admitting physician this morning and agree with the same with following addendum.  Please refer to the morning admission note for more detailed plan of care. ? ?Briefly,  ?On exam she is aaox3, pleasant, on RA, feels better. ?Leg edema already improving- purewick canister full with urine ?Lower extremity appears red and tender to touch. ?No shortness of breath. ? ?88 year old female with past medical history of hypertension, A-fib, hypothyroidism, anemia, history of secondary AV block [facility] with several days of bilateral lower leg swelling has been worsening and not getting red.  She also complains of cough but no chest pain or shortness of breath.  No known sick contacts.  She assumed cough is due to her allergies. ?In the ED, vitals stable afebrile heart rate at 127 but mostly in 70s, labs with mild leukopenia 3.2K, thrombocytopenia 116, hypokalemia 3.3 lactic acid normal troponin negative x210, 15, procalcitonin negative, EKG A-fib heart rate 76, chest x-ray ill-defined patchy opacity at both lung bases may be atelectasis or pneumonia possible small left pleural effusion, T12 compression fracture age indeterminate but possibly acute/subacute, duplex of lower extremities no DVT.  Patient was given doxycycline x1 and admitted for further management. ? ?Issues: ?Bilateral lower leg edema w/ mildly elevated BNP rule out CHF ?Superimposed bilateral lower leg cellulitis: ?BNP mildly elevated. We will continue with treatment for cellulitis with Doxy (amoxicillin allergy as a child but looking into chart she has taken Keflex safely before multiple times for UTI). Continue IV Lasix for leg edema.Check echocardiogram-suspect she may have underlying CHF.Follow-up blood culture ? ?Cough ?Possible small left pleural effusion ?Xray with patchy at lung bases atelectasis or pneumonia: ?however  procalcitonin is negative-doubt pneumonia.  Encourage incentive spirometry PT OT OOB.  Will need follow-up chest x-ray from PCP in 3 to 4 weeks ? ?Hypertension ?PAF: ?Heart rate controlled patient not on anticoagulation at baseline.  BP controlled.  On aspirin.  Will need ongoing discussion with patient/family regarding anticoagulation-preferably on outpatient basis ? ?Mild Thrombocytopenia/mild anemia Check B12, monitor CBC. ? ?Hypothyroidism: TSH elevated 4.6 Free T4 also high at 1.4.  Likely from acute illness.  Continue current home dose Synthroid will need-repeat TSH in 3 wks on outpatient basis to optimize oral regimen ?  ?Deconditioning: seen by PT OT and is stable. ?

## 2021-10-14 NOTE — ED Notes (Signed)
Pt moved to c-pod area at this time, cardiac monitoring continued. Pt is AOX4, on RA, NAD noted. Pt states she walks with walker at home. Pt remains on purewick- brief is dry and clean at this time. Pt's legs elevated on pillow, 1+ pitting edema noted bilaterally. Skin on lower legs/ankles appears dry, pink discoloration noted, pt c/o aching pain in legs when touched.   ?

## 2021-10-14 NOTE — ED Notes (Signed)
Informed RN bed assigned 

## 2021-10-14 NOTE — Evaluation (Addendum)
Physical Therapy Evaluation ?Patient Details ?Name: Destiny Burns ?MRN: [id number] ?DOB: Nov 30, 1926 ?Today's Date: 10/14/2021 ? ?History of Present Illness ? Pt is a 88 y/o F who presented with c/o BLE edema x 2 days & cough. Pt is being treated for leg cellulitis. Pt also has a small pleural effusion. PMH: a-fib, anemia, HTN, hypothyroidism, arthritis, dupuytren contracture, GAD, GERD, mobitz type 2 2nd degree atrioventricular block  ?Clinical Impression ? Pt seen for PT evaluation with pt received in bed & agreeable to tx. Pt reports prior to admission she is mod I with rollator in her home & sleeps in a recliner. On this date, pt requires supervision for bed mobility but is able to complete sit>stand transfers without physical assistance. Pt ambulates around room with RW with forward flexed posture & RW slightly out in front of her (but pt used to using rollator & with thoracic kyphosis). Pt performs all peri hygiene tasks without assistance, except PT provides assistance for donning clean brief. Pt appears to be at her baseline level of function & does not require acute PT services. Pt would benefit from ambulating with nursing staff or mobility tech while in acute setting.    ? ?Pt denies falls in the past 6 months. ?   ? ?Recommendations for follow up therapy are one component of a multi-disciplinary discharge planning process, led by the attending physician.  Recommendations may be updated based on patient status, additional functional criteria and insurance authorization. ? ?Follow Up Recommendations No PT follow up ? ?  ?Assistance Recommended at Discharge PRN  ?Patient can return home with the following ? Help with stairs or ramp for entrance ? ?  ?Equipment Recommendations None recommended by PT (pt already has rollator)  ?Recommendations for Other Services ?    ?  ?Functional Status Assessment Patient has not had a recent decline in their functional status  ? ?  ?Precautions / Restrictions  Precautions ?Precautions: Fall ?Restrictions ?Weight Bearing Restrictions: No  ? ?  ? ?Mobility ? Bed Mobility ?Overal bed mobility: Needs Assistance ?Bed Mobility: Supine to Sit, Sit to Supine ?  ?  ?Supine to sit: Supervision, HOB elevated ?Sit to supine: Supervision, HOB elevated (extra time to elevate BLE onto elevated bed) ?  ?  ?  ? ?Transfers ?Overall transfer level: Needs assistance ?Equipment used: Rolling walker (2 wheels) ?Transfers: Sit to/from Stand ?Sit to Stand: Supervision ?  ?  ?  ?  ?  ?  ?  ? ?Ambulation/Gait ?Ambulation/Gait assistance: Modified independent (Device/Increase time) ?Gait Distance (Feet): 55 Feet ?Assistive device: Rolling walker (2 wheels) ?Gait Pattern/deviations: Decreased step length - right, Decreased step length - left, Decreased stride length ?Gait velocity: decreased ?  ?  ?General Gait Details: mod I with RW in room ? ?Stairs ?  ?  ?  ?  ?  ? ?Wheelchair Mobility ?  ? ?Modified Rankin (Stroke Patients Only) ?  ? ?  ? ?Balance Overall balance assessment: Needs assistance ?Sitting-balance support: No upper extremity supported, Feet supported ?Sitting balance-Leahy Scale: Normal ?  ?  ?Standing balance support: Single extremity supported, During functional activity ?Standing balance-Leahy Scale: Good ?Standing balance comment: pt able to retrieve object from floor without assistance, only supervision with 1UE support on RW; performs peri hygiene in standing with distant supervision without LOB ?  ?  ?  ?  ?  ?  ?  ?  ?  ?  ?  ?   ? ? ? ?Pertinent Vitals/Pain Pain Assessment ?Pain  Assessment: No/denies pain  ? ? ?Home Living Family/patient expects to be discharged to:: Private residence ?Living Arrangements: Alone ?Available Help at Discharge: Family;Available PRN/intermittently ?Type of Home: House ?Home Access: Stairs to enter ?Entrance Stairs-Rails: Left ?Entrance Stairs-Number of Steps: 3 steps with L rail to enter front, 6 steps with L rail to enter carport ?  ?Home  Layout: One level ?Home Equipment: Rollator (4 wheels);Shower seat ?Additional Comments: sleeps in recliner at baseline  ?  ?Prior Function Prior Level of Function : Independent/Modified Independent;Driving ?  ?  ?  ?  ?  ?  ?Mobility Comments: Pt is mod I with rollator, does a little driving, cooks frozen meals, cleans a little ?ADLs Comments: makes freezer meals ?  ? ? ?Hand Dominance  ? Dominant Hand: Left ? ?  ?Extremity/Trunk Assessment  ? Upper Extremity Assessment ?Upper Extremity Assessment: Overall WFL for tasks assessed ?  ? ?Lower Extremity Assessment ?Lower Extremity Assessment: Overall WFL for tasks assessed (BLE erythema & edema distal to knees) ?  ? ?Cervical / Trunk Assessment ?Cervical / Trunk Assessment: Kyphotic  ?Communication  ? Communication: HOH  ?Cognition Arousal/Alertness: Awake/alert ?Behavior During Therapy: [facility] for tasks assessed/performed ?Overall Cognitive Status: Within Functional Limits for tasks assessed ?  ?  ?  ?  ?  ?  ?  ?  ?  ?  ?  ?  ?  ?  ?  ?  ?  ?  ?  ? ?  ?General Comments General comments (skin integrity, edema, etc.): Pt with incontinent BM but performs all peri hygiene standing at toilet without assistance. PT does assist with donning clean brief. ? ?  ?Exercises    ? ?Assessment/Plan  ?  ?PT Assessment Patient does not need any further PT services  ?PT Problem List   ? ?   ?  ?PT Treatment Interventions Functional mobility training;Therapeutic activities   ? ?PT Goals (Current goals can be found in the Care Plan section)  ?Acute Rehab PT Goals ?Patient Stated Goal: get better ?PT Goal Formulation: With patient ?Time For Goal Achievement: 10/28/21 ?Potential to Achieve Goals: Good ? ?  ?Frequency   ?  ? ? ?Co-evaluation   ?  ?  ?  ?  ? ? ?  ?AM-PAC PT "6 Clicks" Mobility  ?Outcome Measure Help needed turning from your back to your side while in a flat bed without using bedrails?: None ?Help needed moving from lying on your back to sitting on the side of a flat bed  without using bedrails?: None ?Help needed moving to and from a bed to a chair (including a wheelchair)?: None ?Help needed standing up from a chair using your arms (e.g., wheelchair or bedside chair)?: None ?Help needed to walk in hospital room?: None ?Help needed climbing 3-5 steps with a railing? : A Little ?6 Click Score: 23 ? ?  ?End of Session   ?Activity Tolerance: Patient tolerated treatment well ?Patient left: in bed;with call bell/phone within reach ?Nurse Communication: Mobility status ?  ?  ? ?Time: [phone] ?PT Time Calculation (min) (ACUTE ONLY): 31 min ? ? ?Charges:   PT Evaluation ?$PT Eval Low Complexity: 1 Low ?PT Treatments ?$Therapeutic Activity: 8-22 mins ?  ?   ? ? ?Lavone Nian, PT, DPT ?10/14/21, 2:42 PM ? ? ?Waunita Schooner ?10/14/2021, 2:42 PM ? ?

## 2021-10-14 NOTE — Assessment & Plan Note (Addendum)
Patient presenting with a hemoglobin of 11.6 which is new typically her H&H's have been 12 and above. ?We will monitor cbc and treat as needed.  ?CBC Latest Ref Rng & Units 10/13/2021 08/23/2021 07/09/2011  ?WBC 4.0 - 10.5 K/uL 3.2(L) 4.6 3.9(L)  ?Hemoglobin 12.0 - 15.0 g/dL 11.6(L) 12.4 12.0  ?Hematocrit 36.0 - 46.0 % 36.6 37.1 35.6(L)  ? ?

## 2021-10-14 NOTE — Progress Notes (Signed)
*  PRELIMINARY RESULTS* ?Echocardiogram ?2D Echocardiogram has been performed. ? ?Destiny Burns ?10/14/2021, 12:59 PM ?

## 2021-10-14 NOTE — Assessment & Plan Note (Signed)
D/W pt about a.fib and irregular heart rate since 2016 in cahrt. ?Risk of bleeding vs risk of stoke with her CHA2DS2 score being 4 putting pt at risk for VTE. ?Pt is currently taking asa 81 mg. ?She wants to d/s her family before starting new medication . ? ?

## 2021-10-14 NOTE — Evaluation (Signed)
Occupational Therapy Evaluation ?Patient Details ?Name: Destiny Burns ?MRN: [id number] ?DOB: November 30, 1926 ?Today's Date: 10/14/2021 ? ? ?History of Present Illness Destiny Burns is a 88 y.o. female with medical history significant of a. Fib, anemia and htn and hypothyroidism presenting today with c/o bilateral lower extremity edema that is been going on for about 2 days., cough but no shortness of breath or chest pain.  ? ?Clinical Impression ?  ?Destiny Burns was seen for OT evaluation this date. Prior to hospital admission, pt was MOD I for mobility and I/ADLs. Pt lives alone in home c 3 STE. Pt presents to acute OT demonstrating impaired ADL performance and functional mobility 2/2 decreased activity tolerance and functional strength/ROM/balance deficits. Pt currently requires CGA + RW for BSC t/f, incontinent of urine in standing. SETUP don/doff socks and LB washing in sitting. SBA + single UE support on bed for perihygiene in standing. Pt would benefit from skilled OT to address noted impairments and functional limitations (see below for any additional details). Upon hospital discharge, recommend HHOT to maximize pt safety and return to PLOF. ?   ? ?Recommendations for follow up therapy are one component of a multi-disciplinary discharge planning process, led by the attending physician.  Recommendations may be updated based on patient status, additional functional criteria and insurance authorization.  ? ?Follow Up Recommendations ? Home health OT  ?  ?Assistance Recommended at Discharge Set up Supervision/Assistance  ?Patient can return home with the following A little help with walking and/or transfers;Help with stairs or ramp for entrance ? ?  ?Functional Status Assessment ? Patient has had a recent decline in their functional status and demonstrates the ability to make significant improvements in function in a reasonable and predictable amount of time.  ?Equipment Recommendations ? BSC/3in1  ?  ?Recommendations  for Other Services   ? ? ?  ?Precautions / Restrictions Precautions ?Precautions: Fall ?Restrictions ?Weight Bearing Restrictions: No  ? ?  ? ?Mobility Bed Mobility ?Overal bed mobility: Needs Assistance ?Bed Mobility: Supine to Sit, Sit to Supine ?  ?  ?Supine to sit: Supervision ?Sit to supine: Min assist ?  ?General bed mobility comments: assist for elevated bed height ?  ? ?Transfers ?Overall transfer level: Needs assistance ?Equipment used: Rolling walker (2 wheels) ?Transfers: Sit to/from Stand, Bed to chair/wheelchair/BSC ?Sit to Stand: Min guard ?  ?  ?Step pivot transfers: Min guard ?  ?  ?  ?  ? ?  ?Balance Overall balance assessment: Needs assistance ?Sitting-balance support: No upper extremity supported, Feet supported ?Sitting balance-Leahy Scale: Good ?  ?  ?Standing balance support: Single extremity supported, During functional activity ?Standing balance-Leahy Scale: Fair ?  ?  ?  ?  ?  ?  ?  ?  ?  ?  ?  ?  ?   ? ?ADL either performed or assessed with clinical judgement  ? ?ADL Overall ADL's : Needs assistance/impaired ?  ?  ?  ?  ?  ?  ?  ?  ?  ?  ?  ?  ?  ?  ?  ?  ?  ?  ?  ?General ADL Comments: CGA + RW for BSC t/f. SETUP don/doff socks and LB washing in sitting. SBA + single UE support on bed for perihygiene in standing.  ? ? ? ? ?Pertinent Vitals/Pain Pain Assessment ?Pain Assessment: No/denies pain  ? ? ? ?Hand Dominance Left ?  ?Extremity/Trunk Assessment Upper Extremity Assessment ?Upper Extremity Assessment: Generalized weakness ?  ?  Lower Extremity Assessment ?Lower Extremity Assessment: Generalized weakness ?  ?  ?  ?Communication Communication ?Communication: HOH ?  ?Cognition Arousal/Alertness: Awake/alert ?Behavior During Therapy: [facility] for tasks assessed/performed ?Overall Cognitive Status: Within Functional Limits for tasks assessed ?  ?  ?  ?  ?  ?  ?  ?  ?  ?  ?  ?  ?  ?  ?  ?  ?  ?  ?  ?   ?   ?   ? ? ?Home Living Family/patient expects to be discharged to:: Private residence ?Living  Arrangements: Alone ?  ?Type of Home: House ?Home Access: Stairs to enter ?Entrance Stairs-Number of Steps: 3 ?  ?Home Layout: One level ?  ?  ?  ?  ?  ?  ?  ?Home Equipment: Rollator (4 wheels);Shower seat ?  ?Additional Comments: sleeps in recliner at baseline ?  ? ?  ?Prior Functioning/Environment Prior Level of Function : Independent/Modified Independent ?  ?  ?  ?  ?  ?  ?  ?ADLs Comments: makes freezer meals ?  ? ?  ?  ?OT Problem List: Decreased strength;Decreased activity tolerance;Impaired balance (sitting and/or standing) ?  ?   ?OT Treatment/Interventions: Self-care/ADL training;Therapeutic exercise;Energy conservation;DME and/or AE instruction;Therapeutic activities;Patient/family education;Balance training  ?  ?OT Goals(Current goals can be found in the care plan section) Acute Rehab OT Goals ?Patient Stated Goal: to feel better ?OT Goal Formulation: With patient ?Time For Goal Achievement: 10/28/21 ?Potential to Achieve Goals: Good ?ADL Goals ?Pt Will Perform Grooming: with modified independence;standing ?Pt Will Perform Lower Body Dressing: with modified independence;sit to/from stand ?Pt Will Transfer to Toilet: with modified independence;ambulating;regular height toilet  ?OT Frequency: Min 3X/week ?  ? ?Co-evaluation   ?  ?  ?  ?  ? ?  ?AM-PAC OT "6 Clicks" Daily Activity     ?Outcome Measure Help from another person eating meals?: None ?Help from another person taking care of personal grooming?: A Little ?Help from another person toileting, which includes using toliet, bedpan, or urinal?: A Little ?Help from another person bathing (including washing, rinsing, drying)?: A Little ?Help from another person to put on and taking off regular upper body clothing?: A Little ?Help from another person to put on and taking off regular lower body clothing?: A Little ?6 Click Score: 19 ?  ?End of Session Equipment Utilized During Treatment: Rolling walker (2 wheels) ? ?Activity Tolerance: Patient tolerated  treatment well ?Patient left: in bed;with call bell/phone within reach ? ?OT Visit Diagnosis: Muscle weakness (generalized) (M62.81)  ?              ?Time: 0938-1829 ?OT Time Calculation (min): 21 min ?Charges:  OT General Charges ?$OT Visit: 1 Visit ?OT Evaluation ?$OT Eval Low Complexity: 1 Low ?OT Treatments ?$Self Care/Home Management : 8-22 mins ? ?Dessie Coma, M.S. OTR/L  ?10/14/21, 1:12 PM  ?ascom [phone] ? ?

## 2021-10-14 NOTE — H&P (Signed)
History and Physical    Patient: Destiny Burns MGQ:[id number] DOB: [birth year] DOA: 10/13/2021 DOS: the patient was seen and examined on 10/14/2021 PCP: Idelle Crouch, MD  Patient coming from: Home  Chief Complaint:  Chief Complaint  Patient presents with   Leg Swelling    HPI: Destiny Burns is a 88 y.o. female with medical history significant of a. Fib, anemia and htn and hypothyroidism presenting today with c/o bilateral lower extremity edema that is been going on for about 2 days., cough but no shortness of breath or chest pain.  Patient has a history of atrial fibrillation but no history of heart failure.  EKG today showed A-fib at 76 QTc of 456.  In the emergency room patient was given doxycycline Zofran blood cultures were obtained x2 and respiratory panel procalcitonin and lactic acid was obtained.  Initial presentation patient was tachycardic and her O2 sat was 90% which however improved.   Review of Systems: Review of Systems  Constitutional: Negative.   HENT: Negative.    Eyes: Negative.   Cardiovascular:  Positive for leg swelling.  Gastrointestinal: Negative.   Genitourinary: Negative.   Musculoskeletal:  Positive for joint pain.  Skin:  Positive for rash.  Neurological: Negative.   Endo/Heme/Allergies: Negative.   All other systems reviewed and are negative.  Past Medical History:  Diagnosis Date   Abnormal glucose    Anemia    as a child   Arthritis    Atrial fibrillation ([place])    Cancer (HCC)    basal cell   Dupuytren contracture    GAD (generalized anxiety disorder)    GERD (gastroesophageal reflux disease)    no meds   Hypertension    Hypothyroidism    Mobitz type 2 second degree atrioventricular block    Pedal edema    SOB (shortness of breath) on exertion    Thyroid disease    Past Surgical History:  Procedure Laterality Date   ABDOMINAL HYSTERECTOMY     BREAST BIOPSY Left 11/08/15   neg   BREAST BIOPSY Right    neg   CATARACT  EXTRACTION W/PHACO Left 07/29/2020   Procedure: CATARACT EXTRACTION PHACO AND INTRAOCULAR LENS PLACEMENT (IOC) LEFT 11.43 01;13.5 15.6%;  Surgeon: Leandrew Koyanagi, MD;  Location: [facility];  Service: Ophthalmology;  Laterality: Left;   CATARACT EXTRACTION W/PHACO Right 12/07/2020   Procedure: CATARACT EXTRACTION PHACO AND INTRAOCULAR LENS PLACEMENT (IOC) RIGHT 20.79 02:27.5 14.1%;  Surgeon: Leandrew Koyanagi, MD;  Location: [facility];  Service: Ophthalmology;  Laterality: Right;   DILATION AND CURETTAGE OF UTERUS     HAND SURGERY     KYPHOPLASTY N/A 05/31/2015   Procedure: KYPHOPLASTY L3;  Surgeon: Hessie Knows, MD;  Location: [facility];  Service: Orthopedics;  Laterality: N/A;   TONSILLECTOMY     Social History:  reports that she has never smoked. She has never used smokeless tobacco. She reports that she does not drink alcohol and does not use drugs.  Allergies  Allergen Reactions   Amoxicillin Swelling   Sulfa Antibiotics     Family History  Problem Relation Age of Onset   Dupuytren's contracture Mother    Breast cancer Mother 48   Dupuytren's contracture Brother    Hypertension Brother    Breast cancer Sister 27   Breast cancer Maternal Aunt 76    Prior to Admission medications   Medication Sig Start Date End Date Taking? Authorizing Provider  acetaminophen (TYLENOL) 325 MG tablet Take 650 mg by mouth every  6 (six) hours as needed. Reported on 01/24/2016    [provider]  aspirin 81 MG tablet Take 81 mg by mouth daily.    [provider]  Calcium Carb-Cholecalciferol (CALCIUM-VITAMIN D) 500-400 MG-UNIT TABS Take by mouth.    [provider]  cholecalciferol (VITAMIN D) 1000 UNITS tablet Take 1,000 Units by mouth daily.    [provider]  diclofenac sodium (VOLTAREN) 1 % GEL Apply 2 g topically 4 (four) times daily. 04/03/18   Laban Emperor, PA-C  furosemide (LASIX) 20 MG tablet  05/01/19   [provider]   hydrochlorothiazide (HYDRODIURIL) 25 MG tablet Take 1 tablet (25 mg total) by mouth daily. 07/21/15   Jackolyn Confer, MD  isosorbide mononitrate (IMDUR) 30 MG 24 hr tablet  05/01/19   [provider]  levothyroxine (SYNTHROID, LEVOTHROID) 25 MCG tablet Take 1 tablet (25 mcg total) by mouth daily. 07/21/15   Jackolyn Confer, MD  lidocaine (LIDODERM) 5 % Place 1 patch onto the skin daily. Remove & Discard patch within 12 hours or as directed by MD 04/03/18   Laban Emperor, PA-C  loratadine (CLARITIN) 10 MG tablet Take 10 mg by mouth.    [provider]  LORazepam (ATIVAN) 0.5 MG tablet  05/15/19   [provider]  mirabegron ER (MYRBETRIQ) 25 MG TB24 tablet Take 25 mg by mouth daily. Reported on 01/24/2016    [provider]  mupirocin ointment (BACTROBAN) 2 % Apply topically. 02/29/16   [provider]  nystatin (MYCOSTATIN/NYSTOP) powder APPLY TO AFFECTED AREA TWICE A DAY 10/20/18   [provider]    Physical Exam: Vitals:   10/13/21 2348 10/14/21 0000 10/14/21 0135 10/14/21 0156  BP: (!) 163/83 (!) 159/68 (!) 141/60 (!) 141/60  Pulse: 85 67  77  Resp: '17 19 15 18  '$ Temp:      TempSrc:      SpO2: 97% 97% 97% 98%  Weight:      Height:      Physical Exam Vitals and nursing note reviewed.  Constitutional:      General: She is not in acute distress.    Appearance: Normal appearance. She is not ill-appearing, toxic-appearing or diaphoretic.  HENT:     Head: Normocephalic and atraumatic.     Right Ear: Hearing and external ear normal.     Left Ear: Hearing and external ear normal.     Nose: Nose normal. No nasal deformity.     Mouth/Throat:     Lips: Pink.     Mouth: Mucous membranes are moist.     Tongue: No lesions.     Pharynx: Oropharynx is clear.  Eyes:     Extraocular Movements: Extraocular movements intact.     Pupils: Pupils are equal, round, and reactive to light.  Neck:     Vascular: No carotid bruit.   Cardiovascular:     Rate and Rhythm: Normal rate and regular rhythm.     Pulses: Normal pulses.     Heart sounds: Normal heart sounds.  Pulmonary:     Effort: Pulmonary effort is normal.     Breath sounds: Rales present.  Abdominal:     General: Bowel sounds are normal. There is no distension.     Palpations: Abdomen is soft. There is no mass.     Tenderness: There is no abdominal tenderness. There is no guarding.     Hernia: No hernia is present.  Musculoskeletal:     Right lower leg:  Edema present.     Left lower leg: Edema present.  Skin:    General: Skin is warm.     Findings: Rash present.  Neurological:     General: No focal deficit present.     Mental Status: She is alert and oriented to person, place, and time.     Cranial Nerves: Cranial nerves 2-12 are intact.     Motor: Motor function is intact.  Psychiatric:        Attention and Perception: Attention normal.        Mood and Affect: Mood normal.        Speech: Speech normal.        Behavior: Behavior normal. Behavior is cooperative.        Cognition and Memory: Cognition normal.    Data Reviewed: > BMP shows a potassium of 3.3, glucose 142, normal kidney function, BNP of 225.5. > CBC shows a white count of 3.2.  Hemoglobin of 11.6 and platelet count of 116. > Chest x-ray shows small left pleural effusion. > Lower extremity venous Doppler negative for DVT. >  Assessment and Plan: Cellulitis of leg Patient started on doxycycline we will continue and elevate both legs and skin care. Discontinue doxycycline if procalcitonin is negative.  I have continued the vancomycin, rocephin and doxycycline , however I do not suspect an infectious source and will d/c in 24 hours if cultures are negative.   Pedal edema Bilateral pedal edema with an elevated BNP and small pleural effusion I suspect patient has congestive heart failure.  Chart review does not show an echo.  We will obtain an echo and start patient on as needed  diuretic therapy with low dose Lasix.  Pleural effusion As above. Patient O2 sats are within normal limits. Supplemental oxygen on worsening. Consistent diuretic therapy resolves. We will consider CT angio if concern for pulmonary embolism.  Hypertension Blood pressure (!) 163/83, pulse 85, temperature 97.9 F (36.6 C), temperature source Oral, resp. rate 17, height '5\' 1"'$  (1.549 m), weight 68 kg, SpO2 97 %. Will continue patient on her regimen of HCTZ, Imdur 30, as needed Lasix. Of note patient is already on Lasix 20 mg on her home regimen.   Hypothyroidism We can check patient's free T4 and TSH and titrate levothyroxine as deemed appropriate.   Paroxysmal atrial fibrillation [facility]) Patient currently needs atrial fibrillation. We will continue patient on Imdur along with aspirin 81.  Atrial fibrillation, chronic ([place]) D/W pt about a.fib and irregular heart rate since 2016 in cahrt. Risk of bleeding vs risk of stoke with her CHA2DS2 score being 4 putting pt at risk for VTE. Pt is currently taking asa 81 mg. She wants to d/s her family before starting new medication .   Thrombocytopenia (HCC) Thrombocytopenia of 116 today. Chart review shows patient has been having thrombocytopenia since the prior past 4 CBCs as below. CBC 10/13/2021 08/23/2021 07/09/2011  WBC 3.2(L) 4.6 3.9(L)  Hemoglobin 11.6(L) 12.4 12.0  Hematocrit 36.6 37.1 35.6(L)  Platelets 116(L) 120(L) 117.0(L)     Anemia Patient presenting with a hemoglobin of 11.6 which is new typically her H&H's have been 12 and above. We will monitor cbc and treat as needed.  CBC Latest Ref Rng & Units 10/13/2021 08/23/2021 07/09/2011  WBC 4.0 - 10.5 K/uL 3.2(L) 4.6 3.9(L)  Hemoglobin 12.0 - 15.0 g/dL 11.6(L) 12.4 12.0  Hematocrit 36.0 - 46.0 % 36.6 37.1 35.6(L)      Advance Care Planning:   Code Status: DNR  Consults:  None   Family Communication:  Larin,Ricky (Son)  [phone] (Home Phone)  Severity of  Illness: The appropriate patient status for this patient is INPATIENT. Inpatient status is judged to be reasonable and necessary in order to provide the required intensity of service to ensure the patient's safety. The patient's presenting symptoms, physical exam findings, and initial radiographic and laboratory data in the context of their chronic comorbidities is felt to place them at high risk for further clinical deterioration. Furthermore, it is not anticipated that the patient will be medically stable for discharge from the hospital within 2 midnights of admission.   * I certify that at the point of admission it is my clinical judgment that the patient will require inpatient hospital care spanning beyond 2 midnights from the point of admission due to high intensity of service, high risk for further deterioration and high frequency of surveillance required.*  Author: Para Skeans, MD 10/14/2021 2:16 AM  For on call review [email or web address].

## 2021-10-14 NOTE — ED Notes (Signed)
Lunch tray ordered for pt.

## 2021-10-15 DIAGNOSIS — Z01818 Encounter for other preprocedural examination: Secondary | ICD-10-CM | POA: Diagnosis not present

## 2021-10-15 DIAGNOSIS — M7989 Other specified soft tissue disorders: Secondary | ICD-10-CM | POA: Diagnosis not present

## 2021-10-15 MED ORDER — MAGNESIUM OXIDE -MG SUPPLEMENT 400 (240 MG) MG PO TABS
400.0000 mg | ORAL_TABLET | Freq: Two times a day (BID) | ORAL | Status: AC
  Administered 2021-10-15 – 2021-10-16 (×2): 400 mg via ORAL
  Filled 2021-10-15 (×2): qty 1

## 2021-10-15 MED ORDER — POTASSIUM CHLORIDE CRYS ER 20 MEQ PO TBCR
20.0000 meq | EXTENDED_RELEASE_TABLET | Freq: Two times a day (BID) | ORAL | Status: AC
  Administered 2021-10-15 – 2021-10-16 (×2): 20 meq via ORAL
  Filled 2021-10-15 (×2): qty 1

## 2021-10-15 NOTE — Progress Notes (Signed)
PROGRESS NOTE  Destiny Burns IRC:[id number] DOB: 1926/10/10 DOA: 10/13/2021 PCP: Idelle Crouch, MD  HPI/Recap of past 24 hours: Destiny Burns is a 88 y.o. female with medical history significant of chronic a. Fib not on oral anticoagulation, anemia, htn, and hypothyroidism who presented from home to [facility] ED with c/o bilateral lower extremity edema for 2 days duration.  Associated with cough but no shortness of breath or chest pain.  Patient has a history of atrial fibrillation but no history of heart failure.  EKG today showed A-fib at 76 QTc of 456.   Work-up revealed unlikely bilateral lower extremity cellulitis, bilateral pedal edema with concern for chronic venous insufficiency.  Bilateral lower extremity Doppler ultrasound done on 10/13/2021, negative for DVT  10/15/2021: Patient was seen and examined at bedside.  She reports her lower extremities edema are improving.  No significant tenderness with palpation.    Assessment/Plan: Principal Problem:   Leg swelling Active Problems:   Cellulitis of leg   Pedal edema   Pleural effusion   Hypertension   Hypothyroidism   Paroxysmal atrial fibrillation (HCC)   Anemia   Thrombocytopenia (HCC)   Atrial fibrillation, chronic ([place])  Ruled out cellulitis of bilateral lower extremity Patient started on doxycycline in the ED, discontinued on 10/15/2021.  No evidence of active infective process.  No tenderness on exam.   Continue to elevate lower extremities bilaterally.    Pedal edema likely from chronic venous stasis. Ongoing diuresing on IV Lasix 20 mg twice daily, continue   Small left pleural effusion Continue diuresing with IV Lasix 20 mg twice daily. Maintain O2 saturation greater than 92%.   Hypertension BPs are soft. She is currently on IV Lasix 20 mg twice daily Continue to closely monitor vital signs.   Hypothyroidism Continue home regimen of levothyroxine.   Chronic atrial fibrillation (HCC) Currently rate  controlled. CHA2DS2-VASc of 4, not on oral anticoagulation prior to admission.  Mild thrombocytopenia (HCC) Thrombocytopenia is improving, platelet count 131 from 116.      Advance Care Planning:   Code Status: DNR      Consults:  None    Family Communication:  Santoli,Ricky (Son)  [phone] (Home Phone)   Severity of Illness:    DVT prophylaxis: SQ heparin 3 times daily.  Status is: Observation           Objective: Vitals:   10/15/21 0011 10/15/21 0421 10/15/21 0500 10/15/21 0910  BP: 126/66 (!) 122/55  100/89  Pulse: 85 62  73  Resp: '18 15  16  '$ Temp: 98.4 F (36.9 C) 98.1 F (36.7 C)  98 F (36.7 C)  TempSrc: Oral Oral    SpO2: 93% 95%  96%  Weight:   65.6 kg   Height:       No intake or output data in the 24 hours ending 10/15/21 1148 Filed Weights   10/13/21 1806 10/15/21 0500  Weight: 68 kg 65.6 kg    Exam:  General: 88 y.o. year-old female well developed well nourished in no acute distress.  Alert and oriented x3. Cardiovascular: Regular rate and rhythm with no rubs or gallops.  No thyromegaly or JVD noted.   Respiratory: Clear to auscultation with no wheezes or rales. Good inspiratory effort. Abdomen: Soft nontender nondistended with normal bowel sounds x4 quadrants. Musculoskeletal: Trace lower extremity edema bilaterally.  Skin: No ulcerative lesions noted or rashes, hyperpigmentation involving bilateral lower extremities. Psychiatry: Mood is appropriate for condition and setting   Data Reviewed: CBC: Recent  Labs  Lab 10/13/21 1813 10/14/21 0456  WBC 3.2* 4.0  HGB 11.6* 12.0  HCT 36.6 37.7  MCV 93.6 91.7  PLT 116* 263*   Basic Metabolic Panel: Recent Labs  Lab 10/13/21 1813 10/14/21 0456  NA 138 138  K 3.3* 3.5  CL 103 104  CO2 28 28  GLUCOSE 142* 106*  BUN 27* 22  CREATININE 0.91 0.79  CALCIUM 9.4 9.5  MG  --  1.8   GFR: Estimated Creatinine Clearance: 38.1 mL/min (by C-G formula based on SCr of 0.79  mg/dL). Liver Function Tests: No results for input(s): AST, ALT, ALKPHOS, BILITOT, PROT, ALBUMIN in the last 168 hours. No results for input(s): LIPASE, AMYLASE in the last 168 hours. No results for input(s): AMMONIA in the last 168 hours. Coagulation Profile: No results for input(s): INR, PROTIME in the last 168 hours. Cardiac Enzymes: No results for input(s): CKTOTAL, CKMB, CKMBINDEX, TROPONINI in the last 168 hours. BNP (last 3 results) No results for input(s): PROBNP in the last 8760 hours. HbA1C: No results for input(s): HGBA1C in the last 72 hours. CBG: No results for input(s): GLUCAP in the last 168 hours. Lipid Profile: No results for input(s): CHOL, HDL, LDLCALC, TRIG, CHOLHDL, LDLDIRECT in the last 72 hours. Thyroid Function Tests: Recent Labs    10/14/21 0456  TSH 4.644*  FREET4 1.43*   Anemia Panel: Recent Labs    10/14/21 1600  VITAMINB12 316   Urine analysis:    Component Value Date/Time   COLORURINE YELLOW 08/25/2013 1609   APPEARANCEUR Clear 01/10/2016 1510   LABSPEC 1.010 08/25/2013 1609   PHURINE 7.0 08/25/2013 1609   GLUCOSEU Negative 01/10/2016 1510   GLUCOSEU NEGATIVE 08/25/2013 1609   HGBUR NEGATIVE 08/25/2013 1609   BILIRUBINUR Negative 01/10/2016 1510   KETONESUR NEGATIVE 08/25/2013 1609   PROTEINUR Negative 01/10/2016 1510   UROBILINOGEN 0.2 08/19/2015 0958   UROBILINOGEN 0.2 08/25/2013 1609   NITRITE Negative 01/10/2016 1510   NITRITE NEGATIVE 08/25/2013 1609   LEUKOCYTESUR Negative 01/10/2016 1510   Sepsis Labs: '@LABRCNTIP'$ (procalcitonin:4,lacticidven:4)  ) Recent Results (from the past 240 hour(s))  Resp Panel by RT-PCR (Flu A&B, Covid) Nasopharyngeal Swab     Status: None   Collection Time: 10/13/21 11:48 PM   Specimen: Nasopharyngeal Swab; Nasopharyngeal(NP) swabs in vial transport medium  Result Value Ref Range Status   SARS Coronavirus 2 by RT PCR NEGATIVE NEGATIVE Final    Comment: (NOTE) SARS-CoV-2 target nucleic acids are  NOT DETECTED.  The SARS-CoV-2 RNA is generally detectable in upper respiratory specimens during the acute phase of infection. The lowest concentration of SARS-CoV-2 viral copies this assay can detect is 138 copies/mL. A negative result does not preclude SARS-Cov-2 infection and should not be used as the sole basis for treatment or other patient management decisions. A negative result may occur with  improper specimen collection/handling, submission of specimen other than nasopharyngeal swab, presence of viral mutation(s) within the areas targeted by this assay, and inadequate number of viral copies(<138 copies/mL). A negative result must be combined with clinical observations, patient history, and epidemiological information. The expected result is Negative.  Fact Sheet for Patients:  EntrepreneurPulse.com.au  Fact Sheet for Healthcare Providers:  IncredibleEmployment.be  This test is no t yet approved or cleared by the [place] FDA and  has been authorized for detection and/or diagnosis of SARS-CoV-2 by FDA under an Emergency Use Authorization (EUA). This EUA will remain  in effect (meaning this test can be used) for the duration of the  COVID-19 declaration under Section 564(b)(1) of the Act, 21 U.S.C.section 360bbb-3(b)(1), unless the authorization is terminated  or revoked sooner.       Influenza A by PCR NEGATIVE NEGATIVE Final   Influenza B by PCR NEGATIVE NEGATIVE Final    Comment: (NOTE) The Xpert Xpress SARS-CoV-2/FLU/RSV plus assay is intended as an aid in the diagnosis of influenza from Nasopharyngeal swab specimens and should not be used as a sole basis for treatment. Nasal washings and aspirates are unacceptable for Xpert Xpress SARS-CoV-2/FLU/RSV testing.  Fact Sheet for Patients: EntrepreneurPulse.com.au  Fact Sheet for Healthcare Providers: IncredibleEmployment.be  This test is not  yet approved or cleared by the [place] FDA and has been authorized for detection and/or diagnosis of SARS-CoV-2 by FDA under an Emergency Use Authorization (EUA). This EUA will remain in effect (meaning this test can be used) for the duration of the COVID-19 declaration under Section 564(b)(1) of the Act, 21 U.S.C. section 360bbb-3(b)(1), unless the authorization is terminated or revoked.  Performed at [facility], [address] [place], [place] [address]   Culture, blood (Routine X 2) w Reflex to ID Panel     Status: None (Preliminary result)   Collection Time: 10/13/21 11:48 PM   Specimen: BLOOD  Result Value Ref Range Status   Specimen Description BLOOD RIGHT ANTECUBITAL  Final   Special Requests   Final    BOTTLES DRAWN AEROBIC AND ANAEROBIC Blood Culture adequate volume   Culture   Final    NO GROWTH 1 DAY Performed at [facility], [address], [place], [place] [address]    Report Status PENDING  Incomplete  Culture, blood (Routine X 2) w Reflex to ID Panel     Status: None (Preliminary result)   Collection Time: 10/13/21 11:53 PM   Specimen: BLOOD  Result Value Ref Range Status   Specimen Description BLOOD BLOOD RIGHT FOREARM  Final   Special Requests   Final    BOTTLES DRAWN AEROBIC AND ANAEROBIC Blood Culture adequate volume   Culture   Final    NO GROWTH 1 DAY Performed at [facility], [address] [place], [place] [address]    Report Status PENDING  Incomplete      Studies: ECHOCARDIOGRAM COMPLETE  Result Date: 10/14/2021    ECHOCARDIOGRAM REPORT   Patient Name:   Destiny Burns Date of Exam: 10/14/2021 Medical Rec #:  [id number]        Height:       61.0 in Accession #:    [id number]       Weight:       150.0 lb Date of Birth:  1926-12-05        BSA:          1.671 m Patient Age:    16 years         BP:           141/60 mmHg Patient Gender: F                HR:           73 bpm. Exam Location:  [facility] Procedure: 2D Echo,  Cardiac Doppler and Color Doppler Indications:     CHF-Acute Systolic 626.94 / W54.62  History:         Patient has no prior history of Echocardiogram examinations.                  Arrythmias:Atrial Fibrillation, Signs/Symptoms:Shortness of  Breath; Risk Factors:Hypertension.  Sonographer:     Alyse Low Roar Referring Phys:  Cass Diagnosing Phys: Ida Rogue MD IMPRESSIONS  1. Left ventricular ejection fraction, by estimation, is 60 to 65%. The left ventricle has normal function. The left ventricle has no regional wall motion abnormalities. There is mild left ventricular hypertrophy. Left ventricular diastolic parameters are indeterminate.  2. Right ventricular systolic function is normal. The right ventricular size is normal. There is normal pulmonary artery systolic pressure. The estimated right ventricular systolic pressure is 03.5 mmHg.  3. The mitral valve is normal in structure. Mild mitral valve regurgitation. No evidence of mitral stenosis.  4. Tricuspid valve regurgitation is mild to moderate.  5. The aortic valve was not well visualized. Aortic valve regurgitation is not visualized. Aortic valve sclerosis is present, with no evidence of aortic valve stenosis.  6. The inferior vena cava is normal in size with greater than 50% respiratory variability, suggesting right atrial pressure of 3 mmHg. FINDINGS  Left Ventricle: Left ventricular ejection fraction, by estimation, is 60 to 65%. The left ventricle has normal function. The left ventricle has no regional wall motion abnormalities. The left ventricular internal cavity size was normal in size. There is  mild left ventricular hypertrophy. Left ventricular diastolic parameters are indeterminate. Right Ventricle: The right ventricular size is normal. No increase in right ventricular wall thickness. Right ventricular systolic function is normal. There is normal pulmonary artery systolic pressure. The tricuspid regurgitant  velocity is 2.55 m/s, and  with an assumed right atrial pressure of 5 mmHg, the estimated right ventricular systolic pressure is 59.7 mmHg. Left Atrium: Left atrial size was normal in size. Right Atrium: Right atrial size was normal in size. Pericardium: There is no evidence of pericardial effusion. Mitral Valve: The mitral valve is normal in structure. Mild mitral valve regurgitation. No evidence of mitral valve stenosis. Tricuspid Valve: The tricuspid valve is normal in structure. Tricuspid valve regurgitation is mild to moderate. No evidence of tricuspid stenosis. Aortic Valve: The aortic valve was not well visualized. Aortic valve regurgitation is not visualized. Aortic valve sclerosis is present, with no evidence of aortic valve stenosis. Aortic valve peak gradient measures 5.0 mmHg. Pulmonic Valve: The pulmonic valve was normal in structure. Pulmonic valve regurgitation is not visualized. No evidence of pulmonic stenosis. Aorta: The aortic root is normal in size and structure. Venous: The inferior vena cava is normal in size with greater than 50% respiratory variability, suggesting right atrial pressure of 3 mmHg. IAS/Shunts: No atrial level shunt detected by color flow Doppler.  LEFT VENTRICLE PLAX 2D LVIDd:         4.20 cm   Diastology LVIDs:         3.12 cm   LV e' medial:    6.64 cm/s LV PW:         1.15 cm   LV E/e' medial:  13.7 LV IVS:        1.45 cm   LV e' lateral:   6.96 cm/s LVOT diam:     2.00 cm   LV E/e' lateral: 13.1 LVOT Area:     3.14 cm  RIGHT VENTRICLE RV Basal diam:  3.04 cm RV Mid diam:    2.90 cm RV S prime:     13.90 cm/s TAPSE (M-mode): 2.0 cm LEFT ATRIUM             Index        RIGHT ATRIUM  Index LA diam:        3.50 cm 2.09 cm/m   RA Area:     8.99 cm LA Vol (A2C):   41.9 ml 25.07 ml/m  RA Volume:   15.70 ml 9.39 ml/m LA Vol (A4C):   27.4 ml 16.39 ml/m LA Biplane Vol: 36.4 ml 21.78 ml/m  AORTIC VALVE                 PULMONIC VALVE AV Area (Vmax): 2.59 cm     PV Vmax:           0.86 m/s AV Vmax:        112.00 cm/s  PV Peak grad:     2.9 mmHg AV Peak Grad:   5.0 mmHg     PR End Diast Vel: 3.93 msec LVOT Vmax:      92.40 cm/s   RVOT Peak grad:   2 mmHg  AORTA Ao Root diam: 3.10 cm Ao Asc diam:  3.20 cm MITRAL VALVE                TRICUSPID VALVE MV Area (PHT): 5.58 cm     TR Peak grad:   26.0 mmHg MV Decel Time: 136 msec     TR Vmax:        255.00 cm/s MV E velocity: 91.20 cm/s MV A velocity: 110.00 cm/s  SHUNTS MV E/A ratio:  0.83         Systemic Diam: 2.00 cm MV A Prime:    11.3 cm/s Ida Rogue MD Electronically signed by Ida Rogue MD Signature Date/Time: 10/14/2021/1:46:44 PM    Final     Scheduled Meds:  aspirin EC  81 mg Oral Daily   docusate sodium  100 mg Oral BID   doxycycline  100 mg Oral Q12H   furosemide  20 mg Intravenous BID   heparin  5,000 Units Subcutaneous Q8H   levothyroxine  25 mcg Oral Q0600   mirabegron ER  25 mg Oral Daily   ondansetron (ZOFRAN) IV  4 mg Intravenous Once   sodium chloride flush  3 mL Intravenous Q12H    Continuous Infusions:   LOS: 0 days     Kayleen Memos, MD [facility] Pager [phone]  If 7PM-7AM, please contact night-coverage [email or web address] Password [facility] 10/15/2021, 11:48 AM

## 2021-10-15 NOTE — Plan of Care (Signed)

## 2021-10-16 DIAGNOSIS — M7989 Other specified soft tissue disorders: Secondary | ICD-10-CM | POA: Diagnosis not present

## 2021-10-16 LAB — BASIC METABOLIC PANEL
Anion gap: 8 (ref 5–15)
BUN: 29 mg/dL — ABNORMAL HIGH (ref 8–23)
CO2: 29 mmol/L (ref 22–32)
Calcium: 9.1 mg/dL (ref 8.9–10.3)
Chloride: 103 mmol/L (ref 98–111)
Creatinine, Ser: 0.87 mg/dL (ref 0.44–1.00)
GFR, Estimated: >60 mL/min (ref >60)
Glucose, Bld: 95 mg/dL (ref 70–99)
Potassium: 3.4 mmol/L — ABNORMAL LOW (ref 3.5–5.1)
Sodium: 140 mmol/L (ref 135–145)

## 2021-10-16 LAB — PHOSPHORUS: Phosphorus: 3.4 mg/dL (ref 2.5–4.6)

## 2021-10-16 LAB — MAGNESIUM: Magnesium: 1.8 mg/dL (ref 1.7–2.4)

## 2021-10-16 MED ORDER — GABAPENTIN 100 MG PO CAPS: 100.0000 mg | ORAL_CAPSULE | Freq: Every day | ORAL | 2 refills | Status: AC

## 2021-10-16 MED ORDER — FUROSEMIDE 20 MG PO TABS: 20.0000 mg | ORAL_TABLET | Freq: Every day | ORAL | 11 refills | Status: AC

## 2021-10-16 MED ORDER — POTASSIUM CHLORIDE CRYS ER 20 MEQ PO TBCR: 40.0000 meq | EXTENDED_RELEASE_TABLET | Freq: Once | ORAL | Status: DC

## 2021-10-16 NOTE — Progress Notes (Signed)
Nutrition Brief Note ? ?Patient identified on the Malnutrition Screening Tool (MST) Report ? ?Wt Readings from Last 15 Encounters:  ?10/16/21 65 kg  ?08/23/21 63.5 kg  ?12/07/20 63.3 kg  ?07/29/20 62.9 kg  ?04/03/18 68 kg  ?01/24/16 68.7 kg  ?01/10/16 69.3 kg  ?08/19/15 67.2 kg  ?06/29/15 66.3 kg  ?05/31/15 67.1 kg  ?05/10/15 67.2 kg  ?04/28/15 58.7 kg  ?03/18/15 69.8 kg  ?09/29/14 70.3 kg  ?07/30/14 70.8 kg  ? ?Destiny Burns is a 88 y.o. female with medical history significant of chronic a. Fib not on oral anticoagulation, anemia, htn, and hypothyroidism who presented from home to [facility] ED with c/o bilateral lower extremity edema for 2 days duration.  Associated with cough but no shortness of breath or chest pain.  Patient has a history of atrial fibrillation but no history of heart failure.  EKG today showed A-fib at 76 QTc of 456. ? ?Pt admitted with rule out cellulitis of lower extremity.  ? ?Reviewed I/O's: +240 ml x 24 hours and -212 ml since admission ? ?Spoke with pt at bedside, who was pleasant and in good spirits today. She reports feeling better and having a good appetite. Pt reports she ate all of her breakfast. PTA pt consumes 3 meals per day (Breakfast: eggs and biscuit with jelly; Lunch and Dinner: frozen dinners). Pt shares that she walks with a walker and does all of her grocery shopping herself (still drives).  ? ?Pt reports her UBW is around 150#. She denies any weight loss. Wt has been stable over the past year.  ? ?Nutrition-Focused physical exam completed. Findings are mild fat depletion, mild to moderate muscle depletion, and mild edema. Suspect fat and muscle depletions are related to advanced age.  ? ?Discussed importance of good meal and supplement intake to promote healing. Pt shares that she has tried Ensure supplements in the past, but does not think she needs them currently. RD will liberalize diet to 2 gram sodium for increased variety of food selections.  ? ?Current diet order is  Heart Healthy with 1.5 L fluid restriction, patient is consuming approximately 80-100% of meals at this time. Labs and medications reviewed.  ? ?No nutrition interventions warranted at this time. If nutrition issues arise, please consult RD.  ? ?Loistine Chance, RD, LDN, CDCES ?Registered Dietitian II ?Certified Diabetes Care and Education Specialist ?Please refer to [facility] for RD and/or RD on-call/weekend/after hours pager   ? ?

## 2021-10-16 NOTE — Progress Notes (Signed)
?  Transition of Care (TOC) Screening Note ? ? ?Patient Details  ?Name: Kaira Stringfield ?Date of Birth: 1926/12/07 ? ? ?Transition of Care (TOC) CM/SW Contact:    ?Alberteen Sam, LCSW ?Phone Number: ?10/16/2021, 8:54 AM ? ?CSW notes patient ambulating with no PT follow up recommended. ? ?Transition of Care Department [facility]) has reviewed patient and no TOC needs have been identified at this time. We will continue to monitor patient advancement through interdisciplinary progression rounds. If new patient transition needs arise, please place a TOC consult. ? Pricilla Riffle, [place] ?[phone] ? ?

## 2021-10-16 NOTE — Progress Notes (Signed)
Occupational Therapy Treatment ?Patient Details ?Name: Destiny Burns ?MRN: [id number] ?DOB: November 12, 1926 ?Today's Date: 10/16/2021 ? ? ?History of present illness Pt is a 88 y/o F who presented with c/o BLE edema x 2 days & cough. Pt is being treated for leg cellulitis. Pt also has a small pleural effusion. PMH: a-fib, anemia, HTN, hypothyroidism, arthritis, dupuytren contracture, GAD, GERD, mobitz type 2 2nd degree atrioventricular block ?  ?OT comments ? Destiny Burns was seen for OT treatment on this date. Upon arrival to room pt seated on toilet, agreeable to tx. Pt requires SUPERVISION + RW for toilet t/f. MOD I perihyigene in sitting. SUPERVISION hand washing standing sink side. Pt making excellent progress toward goals. Will continue to follow POC. Discharge recommendation remains appropriate as pt could benefit from medication mgmt assessment.   ? ?Recommendations for follow up therapy are one component of a multi-disciplinary discharge planning process, led by the attending physician.  Recommendations may be updated based on patient status, additional functional criteria and insurance authorization. ?   ?Follow Up Recommendations ? Home health OT  ?  ?Assistance Recommended at Discharge Set up Supervision/Assistance  ?Patient can return home with the following ? Direct supervision/assist for medications management ?  ?Equipment Recommendations ? None recommended by OT  ?  ?Recommendations for Other Services   ? ?  ?Precautions / Restrictions Precautions ?Precautions: Fall ?Restrictions ?Weight Bearing Restrictions: No  ? ? ?  ? ?Mobility Bed Mobility ?  ?  ?  ?  ?  ?  ?  ?General bed mobility comments: received and left sitting ?  ? ?Transfers ?Overall transfer level: Needs assistance ?Equipment used: Rolling walker (2 wheels) ?Transfers: Sit to/from Stand ?Sit to Stand: Supervision ?  ?  ?  ?  ?  ?  ?  ?  ?Balance Overall balance assessment: Needs assistance ?Sitting-balance support: No upper extremity supported,  Feet supported ?Sitting balance-Leahy Scale: Normal ?  ?  ?Standing balance support: No upper extremity supported, During functional activity ?Standing balance-Leahy Scale: Good ?  ?  ?  ?  ?  ?  ?  ?  ?  ?  ?  ?  ?   ? ?ADL either performed or assessed with clinical judgement  ? ?ADL Overall ADL's : Needs assistance/impaired ?  ?  ?  ?  ?  ?  ?  ?  ?  ?  ?  ?  ?  ?  ?  ?  ?  ?  ?  ?General ADL Comments: SUPERVISION + RW for toilet t/f. MOD I perihyigene in sitting. SUPERVISION hand washing standing sink side. ?  ? ? ? ?Cognition Arousal/Alertness: Awake/alert ?Behavior During Therapy: [facility] for tasks assessed/performed ?Overall Cognitive Status: Within Functional Limits for tasks assessed ?  ?  ?  ?  ?  ?  ?  ?  ?  ?  ?  ?  ?  ?  ?  ?  ?  ?  ?  ?   ?   ?   ?   ? ? ?Pertinent Vitals/ Pain       Pain Assessment ?Pain Assessment: No/denies pain ? ? ?Frequency ? Min 3X/week  ? ? ? ? ?  ?Progress Toward Goals ? ?OT Goals(current goals can now be found in the care plan section) ? Progress towards OT goals: Progressing toward goals ? ?Acute Rehab OT Goals ?Patient Stated Goal: to go home ?OT Goal Formulation: With patient ?Time For Goal Achievement: 10/28/21 ?Potential to  Achieve Goals: Good ?ADL Goals ?Pt Will Perform Grooming: with modified independence;standing ?Pt Will Perform Lower Body Dressing: with modified independence;sit to/from stand ?Pt Will Transfer to Toilet: with modified independence;ambulating;regular height toilet  ?Plan Discharge plan remains appropriate;Frequency remains appropriate   ? ?Co-evaluation ? ? ?   ?  ?  ?  ?  ? ?  ?AM-PAC OT "6 Clicks" Daily Activity     ?Outcome Measure ? ? Help from another person eating meals?: None ?Help from another person taking care of personal grooming?: A Little ?Help from another person toileting, which includes using toliet, bedpan, or urinal?: A Little ?Help from another person bathing (including washing, rinsing, drying)?: A Little ?Help from another person to  put on and taking off regular upper body clothing?: None ?Help from another person to put on and taking off regular lower body clothing?: A Little ?6 Click Score: 20 ? ?  ?End of Session Equipment Utilized During Treatment: Rolling walker (2 wheels) ? ?OT Visit Diagnosis: Muscle weakness (generalized) (M62.81) ?  ?Activity Tolerance Patient tolerated treatment well ?  ?Patient Left in chair;with call bell/phone within reach;with nursing/sitter in room ?  ?Nurse Communication   ?  ? ?   ? ?Time: 2025-4270 ?OT Time Calculation (min): 8 min ? ?Charges: OT General Charges ?$OT Visit: 1 Visit ?OT Treatments ?$Self Care/Home Management : 8-22 mins ? ?Dessie Coma, M.S. OTR/L  ?10/16/21, 1:47 PM  ?ascom [phone] ? ?

## 2021-10-16 NOTE — Plan of Care (Signed)

## 2021-10-16 NOTE — Progress Notes (Signed)
Mobility Specialist - Progress Note ? ? 10/16/21 1200  ?Mobility  ?Activity Ambulated with assistance in hallway  ?Level of Assistance Standby assist, set-up cues, supervision of patient - no hands on  ?Assistive Device Front wheel walker  ?Distance Ambulated (ft) 130 ft  ?Activity Response Tolerated well  ?$Mobility charge 1 Mobility  ? ? ? ?Pt sitting in recliner upon arrival, utilizing RA. Pt ambulated in hallway with supervision. Kyphotic posture with increased distance between body and RW---reports using rollator at baseline. No complaints. Pt returned to recliner with needs in reach.  ? ? ?Kathee Delton ?Mobility Specialist ?10/16/21, 12:41 PM ? ? ? ? ?

## 2021-10-16 NOTE — Discharge Summary (Signed)
Physician Discharge Summary   Patient: Destiny Burns MRN: [id number] DOB: 02-Oct-1926  Admit date:     10/13/2021  Discharge date: 10/16/21  Discharge Physician: Lorella Nimrod   PCP: Idelle Crouch, MD   Recommendations at discharge:  Please obtain BMP in 1 week. Follow-up with primary care provider within a week.  Discharge Diagnoses: Principal Problem:   Leg swelling Active Problems:   Cellulitis of leg   Pedal edema   Pleural effusion   Hypertension   Hypothyroidism   Paroxysmal atrial fibrillation (HCC)   Anemia   Thrombocytopenia (HCC)   Atrial fibrillation, chronic (HCC)  Resolved Problems:   * No resolved hospital problems. *  Hospital Course: 88 year old female with past medical history of hypertension, A-fib, hypothyroidism, anemia, history of secondary AV block [facility] with several days of bilateral lower leg swelling has been worsening and not getting red.  She also complains of cough but no chest pain or shortness of breath.  No known sick contacts.  She assumed cough is due to her allergies. In the ED, vitals stable afebrile heart rate at 127 but mostly in 70s, labs with mild leukopenia 3.2K, thrombocytopenia 116, hypokalemia 3.3 lactic acid normal troponin negative x210, 15, procalcitonin negative, EKG A-fib heart rate 76, chest x-ray ill-defined patchy opacity at both lung bases may be atelectasis or pneumonia possible small left pleural effusion, T12 compression fracture age indeterminate but possibly acute/subacute, duplex of lower extremities no DVT.  Patient was given doxycycline x1 and admitted for further management.  Lower extremity cellulitis was ruled out.  No need for any antibiotics at this time.  Lower extremity edema improved with IV Lasix.  Most likely secondary to chronic venous stasis.  Echocardiogram was without any significant abnormality.  She was asked to continue taking Lasix 20 mg daily, home HCTZ was  discontinued.  She also developed mild hypokalemia secondary to diuretic.  Potassium was replaced before discharge and she will need monitoring of her BMP by PCP for further management..  She will continue with the rest of her home medications and follow-up with her providers for further recommendations.  Assessment and Plan: Cellulitis of leg Patient started on doxycycline we will continue and elevate both legs and skin care. Discontinue doxycycline if procalcitonin is negative.  I have continued the vancomycin, rocephin and doxycycline , however I do not suspect an infectious source and will d/c in 24 hours if cultures are negative.   Pedal edema Bilateral pedal edema with an elevated BNP and small pleural effusion I suspect patient has congestive heart failure.  Chart review does not show an echo.  We will obtain an echo and start patient on as needed diuretic therapy with low dose Lasix.  Pleural effusion As above. Patient O2 sats are within normal limits. Supplemental oxygen on worsening. Consistent diuretic therapy resolves. We will consider CT angio if concern for pulmonary embolism.  Hypertension Blood pressure (!) 163/83, pulse 85, temperature 97.9 F (36.6 C), temperature source Oral, resp. rate 17, height '5\' 1"'$  (1.549 m), weight 68 kg, SpO2 97 %. Will continue patient on her regimen of HCTZ, Imdur 30, as needed Lasix. Of note patient is already on Lasix 20 mg on her home regimen.   Hypothyroidism We can check patient's free T4 and TSH and titrate levothyroxine as deemed appropriate.   Paroxysmal atrial fibrillation [facility]) Patient currently needs atrial fibrillation. We will continue patient on Imdur along with aspirin 81.  Atrial fibrillation, chronic ([place]) D/W pt about  a.fib and irregular heart rate since 2016 in cahrt. Risk of bleeding vs risk of stoke with her CHA2DS2 score being 4 putting pt at risk for VTE. Pt is currently taking asa 81 mg. She wants to d/s her  family before starting new medication .   Thrombocytopenia (HCC) Thrombocytopenia of 116 today. Chart review shows patient has been having thrombocytopenia since the prior past 4 CBCs as below. CBC 10/13/2021 08/23/2021 07/09/2011  WBC 3.2(L) 4.6 3.9(L)  Hemoglobin 11.6(L) 12.4 12.0  Hematocrit 36.6 37.1 35.6(L)  Platelets 116(L) 120(L) 117.0(L)     Anemia Patient presenting with a hemoglobin of 11.6 which is new typically her H&H's have been 12 and above. We will monitor cbc and treat as needed.  CBC Latest Ref Rng & Units 10/13/2021 08/23/2021 07/09/2011  WBC 4.0 - 10.5 K/uL 3.2(L) 4.6 3.9(L)  Hemoglobin 12.0 - 15.0 g/dL 11.6(L) 12.4 12.0  Hematocrit 36.0 - 46.0 % 36.6 37.1 35.6(L)     Consultants: None Procedures performed: None Disposition: Home health Diet recommendation:  Discharge Diet Orders (From admission, onward)     Start     Ordered   10/16/21 0000  Diet - low sodium heart healthy        10/16/21 1312           Cardiac diet DISCHARGE MEDICATION: Allergies as of 10/16/2021       Reactions   Amoxicillin Swelling   Sulfa Antibiotics         Medication List     STOP taking these medications    hydrochlorothiazide 25 MG tablet Commonly known as: HYDRODIURIL   LORazepam 0.5 MG tablet Commonly known as: ATIVAN       TAKE these medications    acetaminophen 325 MG tablet Commonly known as: TYLENOL Take 650 mg by mouth every 6 (six) hours as needed. Reported on 01/24/2016   amitriptyline 10 MG tablet Commonly known as: ELAVIL Take 10 mg by mouth at bedtime.   aspirin 81 MG EC tablet Take 81 mg by mouth daily. What changed: Another medication with the same name was removed. Continue taking this medication, and follow the directions you see here.   Calcium-Vitamin D 500-400 MG-UNIT Tabs Take by mouth.   cholecalciferol 1000 units tablet Commonly known as: VITAMIN D Take 1,000 Units by mouth daily.   diclofenac sodium 1 % Gel Commonly known  as: VOLTAREN Apply 2 g topically 4 (four) times daily.   furosemide 20 MG tablet Commonly known as: Lasix Take 1 tablet (20 mg total) by mouth daily. What changed:  how much to take how to take this when to take this   gabapentin 100 MG capsule Commonly known as: NEURONTIN Take 1 capsule (100 mg total) by mouth at bedtime. What changed:  how much to take when to take this   isosorbide mononitrate 30 MG 24 hr tablet Commonly known as: IMDUR   levothyroxine 25 MCG tablet Commonly known as: SYNTHROID Take 1 tablet (25 mcg total) by mouth daily.   lidocaine 5 % Commonly known as: Lidoderm Place 1 patch onto the skin daily. Remove & Discard patch within 12 hours or as directed by MD   loratadine 10 MG tablet Commonly known as: CLARITIN Take 10 mg by mouth.   mirabegron ER 25 MG Tb24 tablet Commonly known as: MYRBETRIQ Take 25 mg by mouth daily. Reported on 01/24/2016   mupirocin ointment 2 % Commonly known as: BACTROBAN Apply topically.   nystatin powder Commonly known as: MYCOSTATIN/NYSTOP APPLY  TO AFFECTED AREA TWICE A DAY        Follow-up Information     Idelle Crouch, MD. Schedule an appointment as soon as possible for a visit on 10/20/2021.   Specialty: Internal Medicine Why: @ 2:30pm Contact information: [address] [facility] [place] [address] [phone]                Discharge Exam: Danley Danker Weights   10/13/21 1806 10/15/21 0500 10/16/21 0413  Weight: 68 kg 65.6 kg 65 kg   General.  Frail and pleasant elderly lady, in no acute distress. Pulmonary.  Lungs clear bilaterally, normal respiratory effort. CV.  Regular rate and rhythm, no JVD, rub or murmur. Abdomen.  Soft, nontender, nondistended, BS positive. CNS.  Alert and oriented .  No focal neurologic deficit. Extremities.  No edema, no cyanosis, pulses intact and symmetrical. Psychiatry.  Judgment and insight appears normal.   Condition at discharge:  stable  The results of significant diagnostics from this hospitalization (including imaging, microbiology, ancillary and laboratory) are listed below for reference.   Imaging Studies: DG Chest 2 View  Result Date: 10/13/2021 CLINICAL DATA:  Chest pain.  Bilateral leg swelling. EXAM: CHEST - 2 VIEW COMPARISON:  Remote radiograph and CT 12/12/2007 FINDINGS: The heart is normal in size. Atherosclerosis of the thoracic aorta with mild tortuosity. There is biapical pleuroparenchymal scarring. Ill-defined patchy opacity at both lung bases. No pulmonary edema. There may be a small left pleural effusion. The bones are under mineralized. T12 compression fracture is age indeterminate, but not definitively seen on 08/23/2021 abdominal CT. L3 compression deformity with vertebral augmentation of chronic IMPRESSION: 1. Ill-defined patchy opacity at both lung bases, may be atelectasis or pneumonia. Possible small left pleural effusion. 2. T12 compression fracture, age indeterminate but possibly acute/subacute. Electronically Signed   By: Keith Rake M.D.   On: 10/13/2021 18:34   US Venous Img Lower Bilateral (DVT)  Result Date: 10/13/2021 CLINICAL DATA:  Bilateral lower extremity swelling x2 days. EXAM: BILATERAL LOWER EXTREMITY VENOUS DOPPLER ULTRASOUND TECHNIQUE: Gray-scale sonography with graded compression, as well as color Doppler and duplex ultrasound were performed to evaluate the lower extremity deep venous systems from the level of the common femoral vein and including the common femoral, femoral, profunda femoral, popliteal and calf veins including the posterior tibial, peroneal and gastrocnemius veins when visible. The superficial great saphenous vein was also interrogated. Spectral Doppler was utilized to evaluate flow at rest and with distal augmentation maneuvers in the common femoral, femoral and popliteal veins. COMPARISON:  None. FINDINGS: RIGHT LOWER EXTREMITY Common Femoral Vein: No evidence of  thrombus. Normal compressibility, respiratory phasicity and response to augmentation. Saphenofemoral Junction: No evidence of thrombus. Normal compressibility and flow on color Doppler imaging. Profunda Femoral Vein: No evidence of thrombus. Normal compressibility and flow on color Doppler imaging. Femoral Vein: No evidence of thrombus. Normal compressibility, respiratory phasicity and response to augmentation. Popliteal Vein: No evidence of thrombus. Normal compressibility, respiratory phasicity and response to augmentation. Calf Veins: No evidence of thrombus. Normal compressibility and flow on color Doppler imaging. Superficial Great Saphenous Vein: No evidence of thrombus. Normal compressibility. Venous Reflux:  None. Other Findings:  None. LEFT LOWER EXTREMITY Common Femoral Vein: No evidence of thrombus. Normal compressibility, respiratory phasicity and response to augmentation. Saphenofemoral Junction: No evidence of thrombus. Normal compressibility and flow on color Doppler imaging. Profunda Femoral Vein: No evidence of thrombus. Normal compressibility and flow on color Doppler imaging. Femoral Vein: No evidence  of thrombus. Normal compressibility, respiratory phasicity and response to augmentation. Popliteal Vein: No evidence of thrombus. Normal compressibility, respiratory phasicity and response to augmentation. Calf Veins: No evidence of thrombus. Normal compressibility and flow on color Doppler imaging. Superficial Great Saphenous Vein: No evidence of thrombus. Normal compressibility. Venous Reflux:  None. Other Findings:  None. IMPRESSION: No evidence of deep venous thrombosis in either lower extremity. Electronically Signed   By: Virgina Norfolk M.D.   On: 10/13/2021 19:02   ECHOCARDIOGRAM COMPLETE  Result Date: 10/14/2021    ECHOCARDIOGRAM REPORT   Patient Name:   Lauralei Janota Date of Exam: 10/14/2021 Medical Rec #:  [id number]        Height:       61.0 in Accession #:    [id number]        Weight:       150.0 lb Date of Birth:  [birth year]        BSA:          1.671 m Patient Age:    88 years         BP:           141/60 mmHg Patient Gender: F                HR:           73 bpm. Exam Location:  [facility] Procedure: 2D Echo, Cardiac Doppler and Color Doppler Indications:     CHF-Acute Systolic 935.70 / V77.93  History:         Patient has no prior history of Echocardiogram examinations.                  Arrythmias:Atrial Fibrillation, Signs/Symptoms:Shortness of                  Breath; Risk Factors:Hypertension.  Sonographer:     Alyse Low Roar Referring Phys:  [place] Diagnosing Phys: Ida Rogue MD IMPRESSIONS  1. Left ventricular ejection fraction, by estimation, is 60 to 65%. The left ventricle has normal function. The left ventricle has no regional wall motion abnormalities. There is mild left ventricular hypertrophy. Left ventricular diastolic parameters are indeterminate.  2. Right ventricular systolic function is normal. The right ventricular size is normal. There is normal pulmonary artery systolic pressure. The estimated right ventricular systolic pressure is 90.3 mmHg.  3. The mitral valve is normal in structure. Mild mitral valve regurgitation. No evidence of mitral stenosis.  4. Tricuspid valve regurgitation is mild to moderate.  5. The aortic valve was not well visualized. Aortic valve regurgitation is not visualized. Aortic valve sclerosis is present, with no evidence of aortic valve stenosis.  6. The inferior vena cava is normal in size with greater than 50% respiratory variability, suggesting right atrial pressure of 3 mmHg. FINDINGS  Left Ventricle: Left ventricular ejection fraction, by estimation, is 60 to 65%. The left ventricle has normal function. The left ventricle has no regional wall motion abnormalities. The left ventricular internal cavity size was normal in size. There is  mild left ventricular hypertrophy. Left ventricular diastolic parameters are indeterminate.  Right Ventricle: The right ventricular size is normal. No increase in right ventricular wall thickness. Right ventricular systolic function is normal. There is normal pulmonary artery systolic pressure. The tricuspid regurgitant velocity is 2.55 m/s, and  with an assumed right atrial pressure of 5 mmHg, the estimated right ventricular systolic pressure is 00.9 mmHg. Left Atrium: Left atrial size was normal in size. Right Atrium: Right  atrial size was normal in size. Pericardium: There is no evidence of pericardial effusion. Mitral Valve: The mitral valve is normal in structure. Mild mitral valve regurgitation. No evidence of mitral valve stenosis. Tricuspid Valve: The tricuspid valve is normal in structure. Tricuspid valve regurgitation is mild to moderate. No evidence of tricuspid stenosis. Aortic Valve: The aortic valve was not well visualized. Aortic valve regurgitation is not visualized. Aortic valve sclerosis is present, with no evidence of aortic valve stenosis. Aortic valve peak gradient measures 5.0 mmHg. Pulmonic Valve: The pulmonic valve was normal in structure. Pulmonic valve regurgitation is not visualized. No evidence of pulmonic stenosis. Aorta: The aortic root is normal in size and structure. Venous: The inferior vena cava is normal in size with greater than 50% respiratory variability, suggesting right atrial pressure of 3 mmHg. IAS/Shunts: No atrial level shunt detected by color flow Doppler.  LEFT VENTRICLE PLAX 2D LVIDd:         4.20 cm   Diastology LVIDs:         3.12 cm   LV e' medial:    6.64 cm/s LV PW:         1.15 cm   LV E/e' medial:  13.7 LV IVS:        1.45 cm   LV e' lateral:   6.96 cm/s LVOT diam:     2.00 cm   LV E/e' lateral: 13.1 LVOT Area:     3.14 cm  RIGHT VENTRICLE RV Basal diam:  3.04 cm RV Mid diam:    2.90 cm RV S prime:     13.90 cm/s TAPSE (M-mode): 2.0 cm LEFT ATRIUM             Index        RIGHT ATRIUM          Index LA diam:        3.50 cm 2.09 cm/m   RA Area:     8.99  cm LA Vol (A2C):   41.9 ml 25.07 ml/m  RA Volume:   15.70 ml 9.39 ml/m LA Vol (A4C):   27.4 ml 16.39 ml/m LA Biplane Vol: 36.4 ml 21.78 ml/m  AORTIC VALVE                 PULMONIC VALVE AV Area (Vmax): 2.59 cm     PV Vmax:          0.86 m/s AV Vmax:        112.00 cm/s  PV Peak grad:     2.9 mmHg AV Peak Grad:   5.0 mmHg     PR End Diast Vel: 3.93 msec LVOT Vmax:      92.40 cm/s   RVOT Peak grad:   2 mmHg  AORTA Ao Root diam: 3.10 cm Ao Asc diam:  3.20 cm MITRAL VALVE                TRICUSPID VALVE MV Area (PHT): 5.58 cm     TR Peak grad:   26.0 mmHg MV Decel Time: 136 msec     TR Vmax:        255.00 cm/s MV E velocity: 91.20 cm/s MV A velocity: 110.00 cm/s  SHUNTS MV E/A ratio:  0.83         Systemic Diam: 2.00 cm MV A Prime:    11.3 cm/s Ida Rogue MD Electronically signed by Ida Rogue MD Signature Date/Time: 10/14/2021/1:46:44 PM    Final     Microbiology: Results  for orders placed or performed during the hospital encounter of 10/13/21  Resp Panel by RT-PCR (Flu A&B, Covid) Nasopharyngeal Swab     Status: None   Collection Time: 10/13/21 11:48 PM   Specimen: Nasopharyngeal Swab; Nasopharyngeal(NP) swabs in vial transport medium  Result Value Ref Range Status   SARS Coronavirus 2 by RT PCR NEGATIVE NEGATIVE Final    Comment: (NOTE) SARS-CoV-2 target nucleic acids are NOT DETECTED.  The SARS-CoV-2 RNA is generally detectable in upper respiratory specimens during the acute phase of infection. The lowest concentration of SARS-CoV-2 viral copies this assay can detect is 138 copies/mL. A negative result does not preclude SARS-Cov-2 infection and should not be used as the sole basis for treatment or other patient management decisions. A negative result may occur with  improper specimen collection/handling, submission of specimen other than nasopharyngeal swab, presence of viral mutation(s) within the areas targeted by this assay, and inadequate number of viral copies(<138 copies/mL). A  negative result must be combined with clinical observations, patient history, and epidemiological information. The expected result is Negative.  Fact Sheet for Patients:  EntrepreneurPulse.com.au  Fact Sheet for Healthcare Providers:  IncredibleEmployment.be  This test is no t yet approved or cleared by the [place] FDA and  has been authorized for detection and/or diagnosis of SARS-CoV-2 by FDA under an Emergency Use Authorization (EUA). This EUA will remain  in effect (meaning this test can be used) for the duration of the COVID-19 declaration under Section 564(b)(1) of the Act, 21 U.S.C.section 360bbb-3(b)(1), unless the authorization is terminated  or revoked sooner.       Influenza A by PCR NEGATIVE NEGATIVE Final   Influenza B by PCR NEGATIVE NEGATIVE Final    Comment: (NOTE) The Xpert Xpress SARS-CoV-2/FLU/RSV plus assay is intended as an aid in the diagnosis of influenza from Nasopharyngeal swab specimens and should not be used as a sole basis for treatment. Nasal washings and aspirates are unacceptable for Xpert Xpress SARS-CoV-2/FLU/RSV testing.  Fact Sheet for Patients: EntrepreneurPulse.com.au  Fact Sheet for Healthcare Providers: IncredibleEmployment.be  This test is not yet approved or cleared by the [place] FDA and has been authorized for detection and/or diagnosis of SARS-CoV-2 by FDA under an Emergency Use Authorization (EUA). This EUA will remain in effect (meaning this test can be used) for the duration of the COVID-19 declaration under Section 564(b)(1) of the Act, 21 U.S.C. section 360bbb-3(b)(1), unless the authorization is terminated or revoked.  Performed at [facility], [address] [place], [place] [address]   Culture, blood (Routine X 2) w Reflex to ID Panel     Status: None (Preliminary result)   Collection Time: 10/13/21 11:48 PM   Specimen: BLOOD   Result Value Ref Range Status   Specimen Description BLOOD RIGHT ANTECUBITAL  Final   Special Requests   Final    BOTTLES DRAWN AEROBIC AND ANAEROBIC Blood Culture adequate volume   Culture   Final    NO GROWTH 2 DAYS Performed at [facility], [address] [place], [place] [address]    Report Status PENDING  Incomplete  Culture, blood (Routine X 2) w Reflex to ID Panel     Status: None (Preliminary result)   Collection Time: 10/13/21 11:53 PM   Specimen: BLOOD  Result Value Ref Range Status   Specimen Description BLOOD BLOOD RIGHT FOREARM  Final   Special Requests   Final    BOTTLES DRAWN AEROBIC AND ANAEROBIC Blood Culture adequate volume  Culture   Final    NO GROWTH 2 DAYS Performed at [facility], [place],  [address]    Report Status PENDING  Incomplete    Labs: CBC: Recent Labs  Lab 10/13/21 1813 10/14/21 0456  WBC 3.2* 4.0  HGB 11.6* 12.0  HCT 36.6 37.7  MCV 93.6 91.7  PLT 116* 709*   Basic Metabolic Panel: Recent Labs  Lab 10/13/21 1813 10/14/21 0456 10/16/21 0402  NA 138 138 140  K 3.3* 3.5 3.4*  CL 103 104 103  CO2 '28 28 29  '$ GLUCOSE 142* 106* 95  BUN 27* 22 29*  CREATININE 0.91 0.79 0.87  CALCIUM 9.4 9.5 9.1  MG  --  1.8 1.8  PHOS  --   --  3.4   Liver Function Tests: No results for input(s): AST, ALT, ALKPHOS, BILITOT, PROT, ALBUMIN in the last 168 hours. CBG: No results for input(s): GLUCAP in the last 168 hours.  Discharge time spent: greater than 30 minutes.  Signed: Lorella Nimrod, MD [facility] 10/16/2021

## 2021-10-19 LAB — CULTURE, BLOOD (ROUTINE X 2)
Culture: NO GROWTH
Culture: NO GROWTH
Special Requests: ADEQUATE
Special Requests: ADEQUATE

## 2021-10-20 DIAGNOSIS — E039 Hypothyroidism, unspecified: Secondary | ICD-10-CM | POA: Diagnosis not present

## 2021-10-20 DIAGNOSIS — J90 Pleural effusion, not elsewhere classified: Secondary | ICD-10-CM | POA: Diagnosis not present

## 2021-10-20 DIAGNOSIS — R600 Localized edema: Secondary | ICD-10-CM | POA: Diagnosis not present

## 2021-10-20 DIAGNOSIS — R7309 Other abnormal glucose: Secondary | ICD-10-CM | POA: Diagnosis not present

## 2021-10-20 DIAGNOSIS — I480 Paroxysmal atrial fibrillation: Secondary | ICD-10-CM | POA: Diagnosis not present

## 2021-11-20 DIAGNOSIS — G8929 Other chronic pain: Secondary | ICD-10-CM | POA: Diagnosis not present

## 2021-11-20 DIAGNOSIS — I441 Atrioventricular block, second degree: Secondary | ICD-10-CM | POA: Diagnosis not present

## 2021-11-20 DIAGNOSIS — M159 Polyosteoarthritis, unspecified: Secondary | ICD-10-CM | POA: Diagnosis not present

## 2021-11-20 DIAGNOSIS — I480 Paroxysmal atrial fibrillation: Secondary | ICD-10-CM | POA: Diagnosis not present

## 2021-11-20 DIAGNOSIS — D649 Anemia, unspecified: Secondary | ICD-10-CM | POA: Diagnosis not present

## 2021-11-20 DIAGNOSIS — M545 Low back pain, unspecified: Secondary | ICD-10-CM | POA: Diagnosis not present

## 2021-11-20 DIAGNOSIS — I10 Essential (primary) hypertension: Secondary | ICD-10-CM | POA: Diagnosis not present

## 2021-12-01 DIAGNOSIS — Z79899 Other long term (current) drug therapy: Secondary | ICD-10-CM | POA: Diagnosis not present

## 2021-12-15 DIAGNOSIS — M7989 Other specified soft tissue disorders: Secondary | ICD-10-CM | POA: Diagnosis not present

## 2021-12-21 ENCOUNTER — Ambulatory Visit: Payer: Medicare HMO | Admitting: Podiatry

## 2021-12-21 DIAGNOSIS — M7989 Other specified soft tissue disorders: Secondary | ICD-10-CM | POA: Diagnosis not present

## 2022-01-12 DIAGNOSIS — I10 Essential (primary) hypertension: Secondary | ICD-10-CM | POA: Diagnosis not present

## 2022-01-12 DIAGNOSIS — I441 Atrioventricular block, second degree: Secondary | ICD-10-CM | POA: Diagnosis not present

## 2022-01-12 DIAGNOSIS — D649 Anemia, unspecified: Secondary | ICD-10-CM | POA: Diagnosis not present

## 2022-01-12 DIAGNOSIS — M159 Polyosteoarthritis, unspecified: Secondary | ICD-10-CM | POA: Diagnosis not present

## 2022-01-12 DIAGNOSIS — I480 Paroxysmal atrial fibrillation: Secondary | ICD-10-CM | POA: Diagnosis not present

## 2022-01-12 DIAGNOSIS — M545 Low back pain, unspecified: Secondary | ICD-10-CM | POA: Diagnosis not present

## 2022-01-12 DIAGNOSIS — G8929 Other chronic pain: Secondary | ICD-10-CM | POA: Diagnosis not present

## 2022-02-01 DIAGNOSIS — I872 Venous insufficiency (chronic) (peripheral): Secondary | ICD-10-CM | POA: Diagnosis not present

## 2022-02-01 DIAGNOSIS — Z0000 Encounter for general adult medical examination without abnormal findings: Secondary | ICD-10-CM | POA: Diagnosis not present

## 2022-02-01 DIAGNOSIS — R739 Hyperglycemia, unspecified: Secondary | ICD-10-CM | POA: Diagnosis not present

## 2022-02-01 DIAGNOSIS — E079 Disorder of thyroid, unspecified: Secondary | ICD-10-CM | POA: Diagnosis not present

## 2022-02-01 DIAGNOSIS — F419 Anxiety disorder, unspecified: Secondary | ICD-10-CM | POA: Diagnosis not present

## 2022-02-01 DIAGNOSIS — I10 Essential (primary) hypertension: Secondary | ICD-10-CM | POA: Diagnosis not present

## 2022-02-10 DEATH — deceased

## 2022-03-16 ENCOUNTER — Encounter (INDEPENDENT_AMBULATORY_CARE_PROVIDER_SITE_OTHER): Payer: Medicare HMO | Admitting: Vascular Surgery

## 2022-06-10 IMAGING — US US EXTREM LOW VENOUS
1 series · 13 of 24 positions shown · non-contrast
Comparison: None.

CLINICAL DATA: Bilateral lower extremity swelling x2 days.



[Series 1: us venous img lower bilat (dvt) · portal-venous · 13 of 59 slices shown]
[im 1/59]
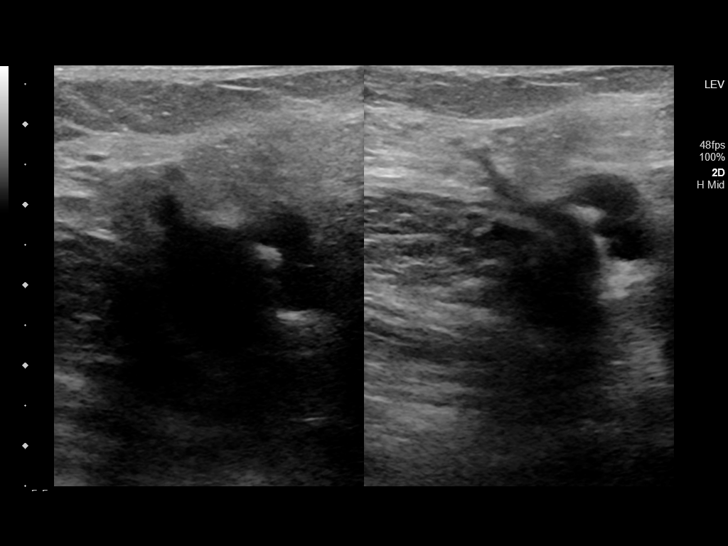
[im 6/59]
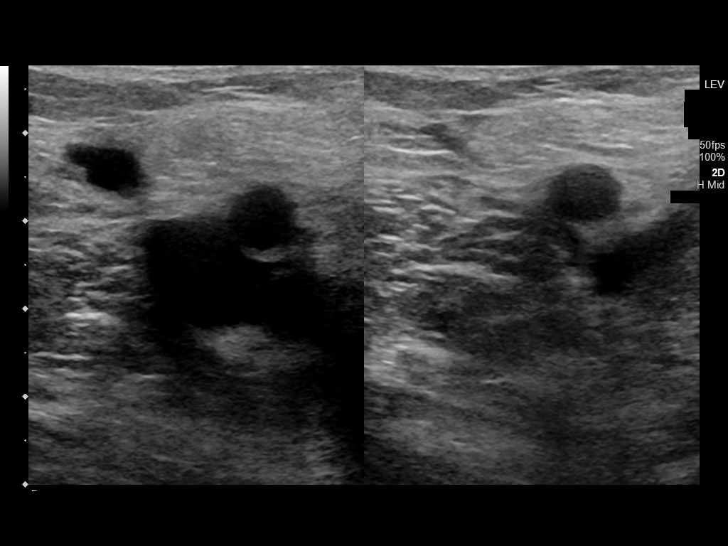
[im 11/59]
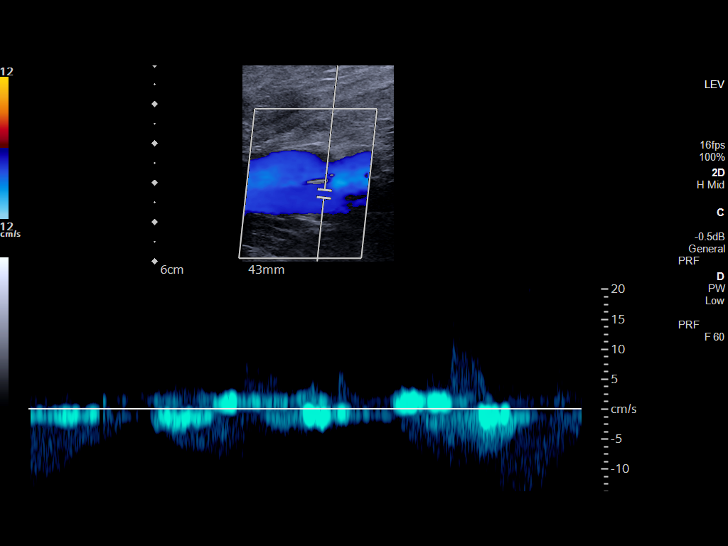
[im 16/59]
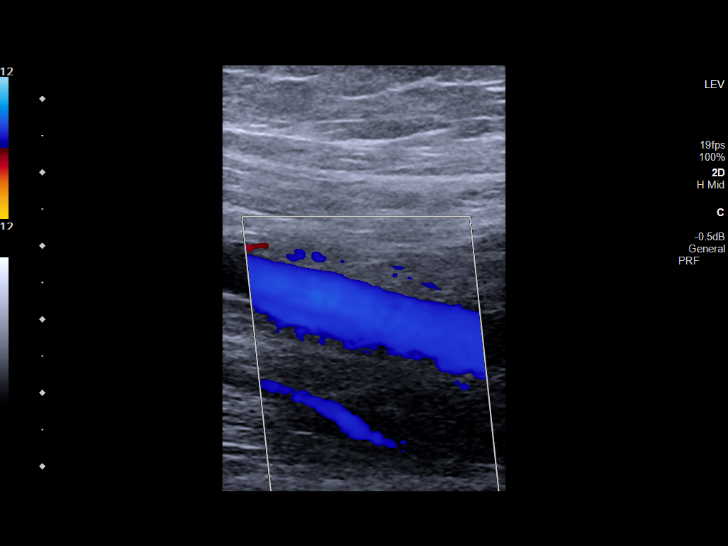
[im 21/59]
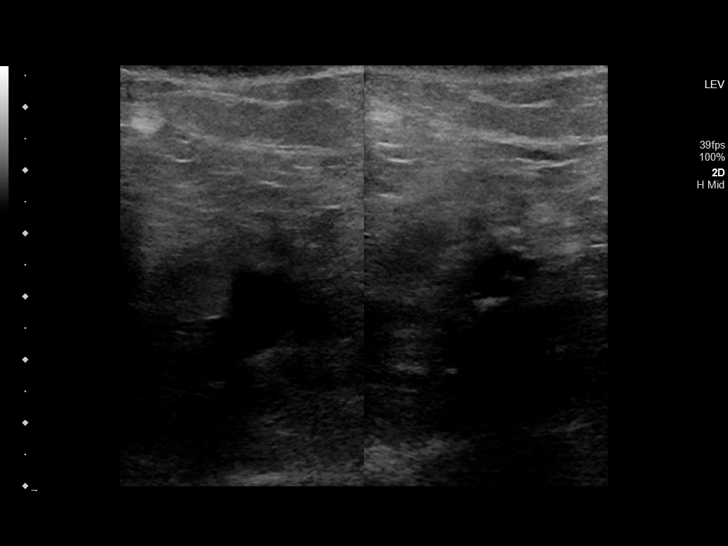
[im 26/59]
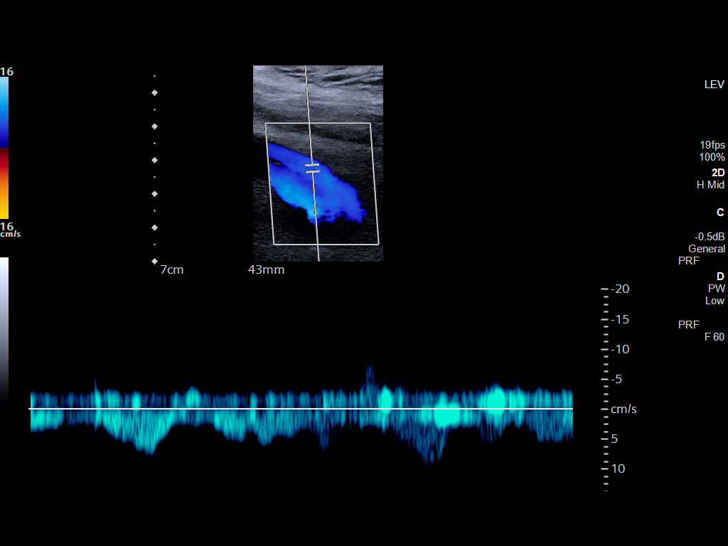
[im 31/59]
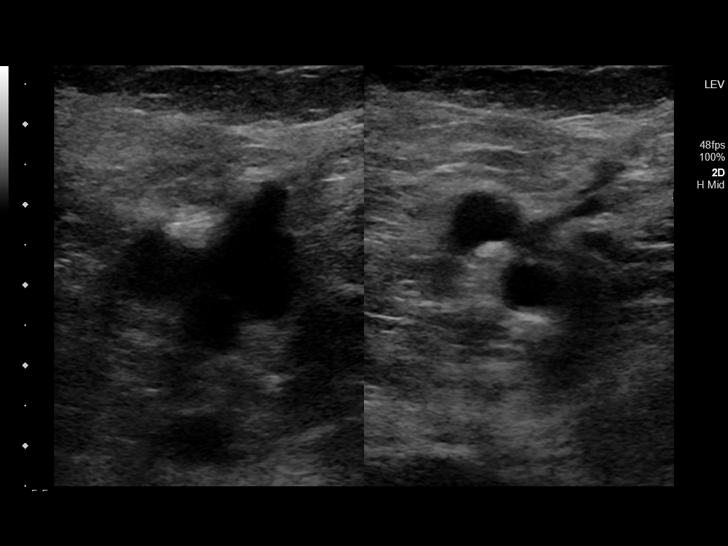
[im 33/59]
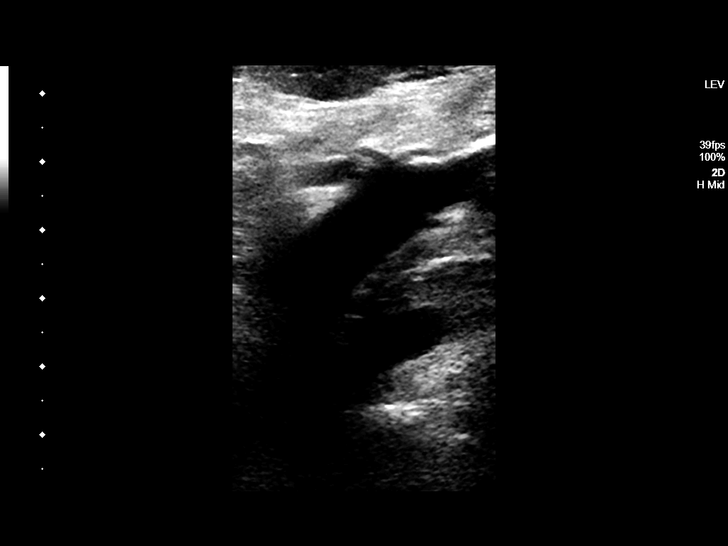
[im 38/59]
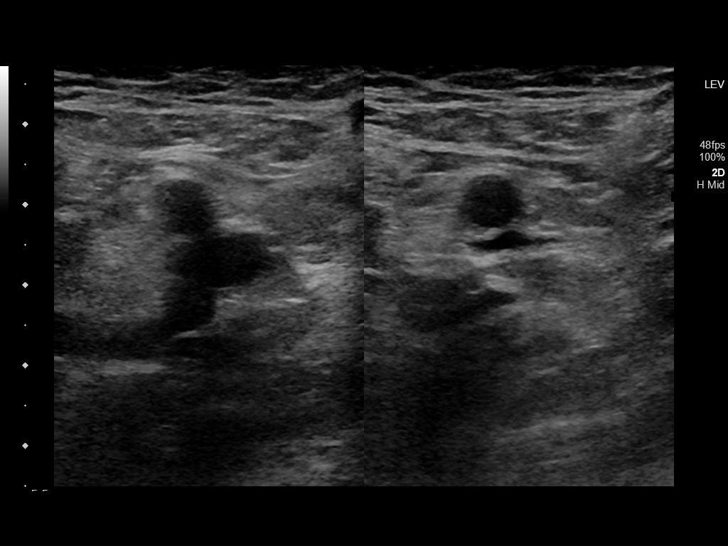
[im 43/59]
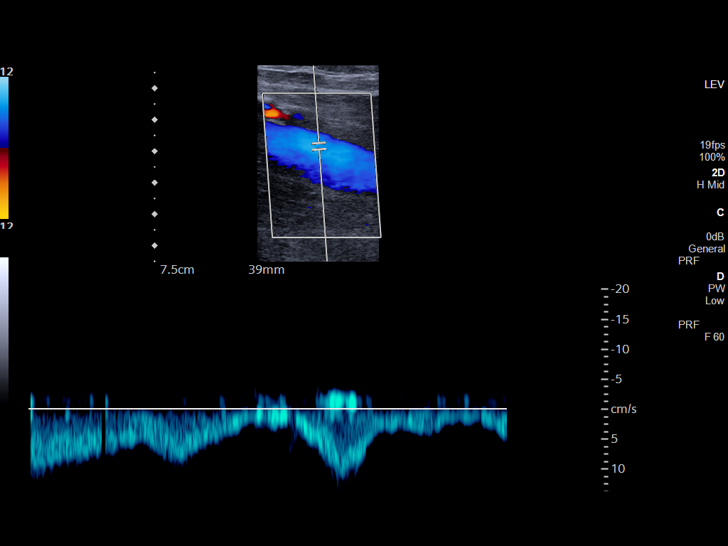
[im 48/59]
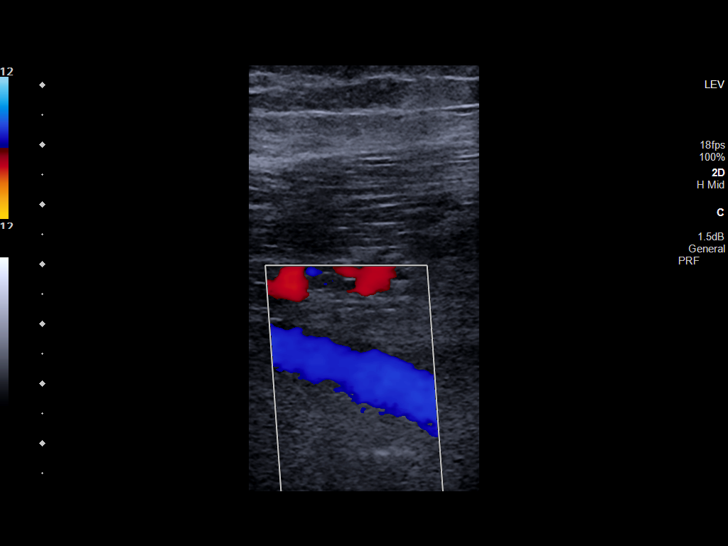
[im 53/59]
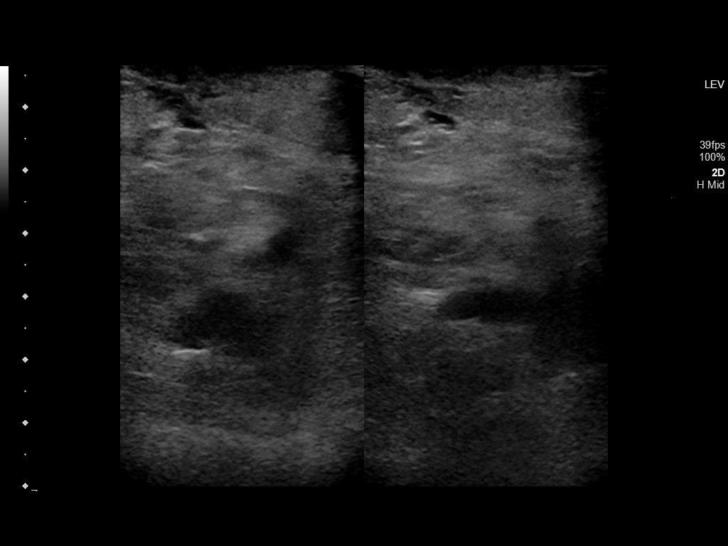
[im 59/59]
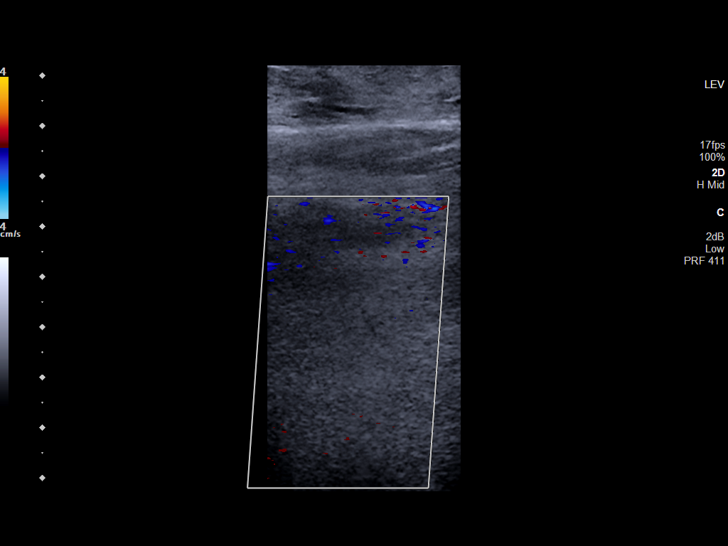

[13 of 24 positions shown; findings below may reference images not displayed]

FINDINGS: RIGHT LOWER EXTREMITY

Common Femoral Vein: No evidence of thrombus. Normal
compressibility, respiratory phasicity and response to augmentation.

Saphenofemoral Junction: No evidence of thrombus. Normal
compressibility and flow on color Doppler imaging.

Profunda Femoral Vein: No evidence of thrombus. Normal
compressibility and flow on color Doppler imaging.

Femoral Vein: No evidence of thrombus. Normal compressibility,
respiratory phasicity and response to augmentation.

Popliteal Vein: No evidence of thrombus. Normal compressibility,
respiratory phasicity and response to augmentation.

Calf Veins: No evidence of thrombus. Normal compressibility and flow
on color Doppler imaging.

Superficial Great Saphenous Vein: No evidence of thrombus. Normal
compressibility.

Venous Reflux:  None.

Other Findings:  None.

LEFT LOWER EXTREMITY

Common Femoral Vein: No evidence of thrombus. Normal
compressibility, respiratory phasicity and response to augmentation.

Saphenofemoral Junction: No evidence of thrombus. Normal
compressibility and flow on color Doppler imaging.

Profunda Femoral Vein: No evidence of thrombus. Normal
compressibility and flow on color Doppler imaging.

Femoral Vein: No evidence of thrombus. Normal compressibility,
respiratory phasicity and response to augmentation.

Popliteal Vein: No evidence of thrombus. Normal compressibility,
respiratory phasicity and response to augmentation.

Calf Veins: No evidence of thrombus. Normal compressibility and flow
on color Doppler imaging.

Superficial Great Saphenous Vein: No evidence of thrombus. Normal
compressibility.

Venous Reflux:  None.

Other Findings:  None.
IMPRESSION: No evidence of deep venous thrombosis in either lower extremity.

## 2022-06-10 IMAGING — CR DG CHEST 2V
2 series · 2 of 2 positions shown · non-contrast
Comparison: Remote radiograph and CT 12/12/2007

CLINICAL DATA: Chest pain.  Bilateral leg swelling.

EXAM:
CHEST - 2 VIEW

[chest ap]
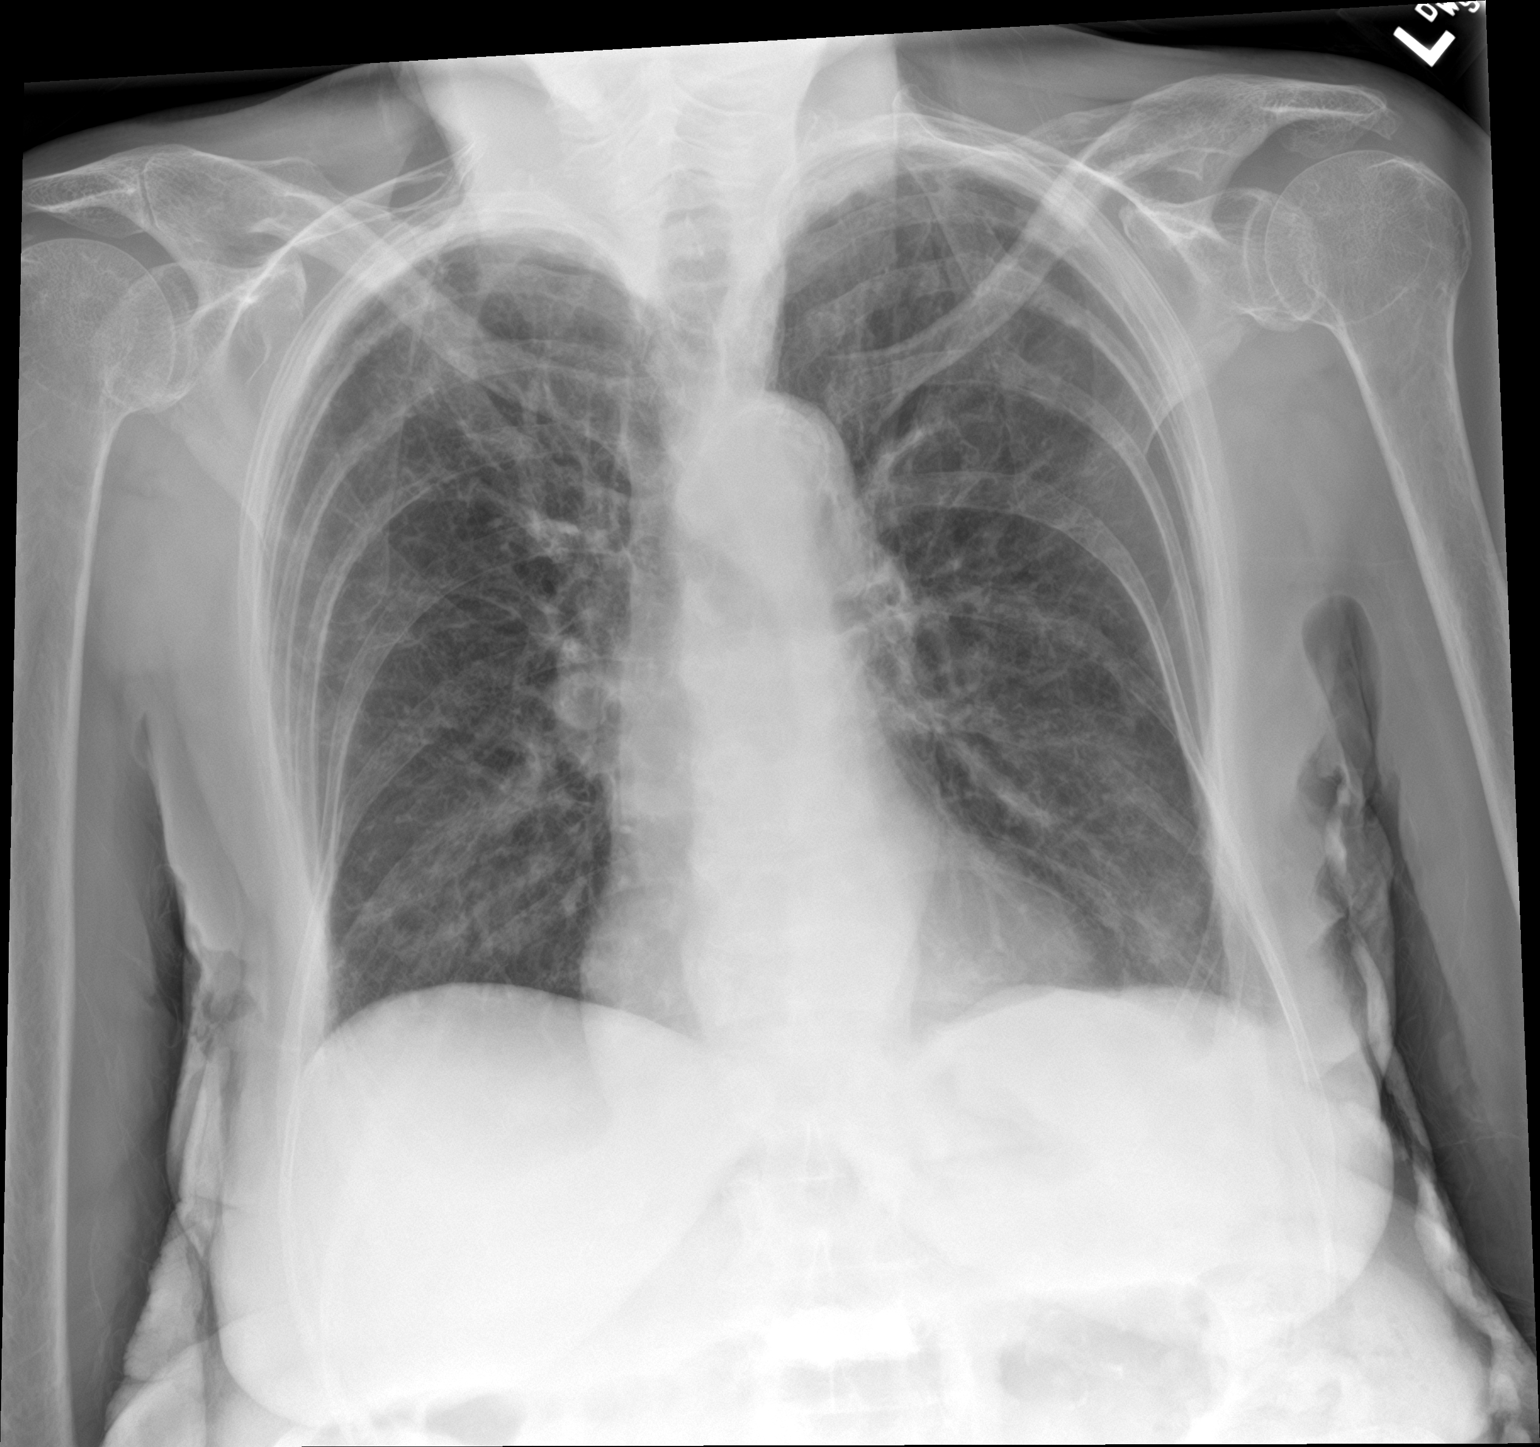

[chest lat]
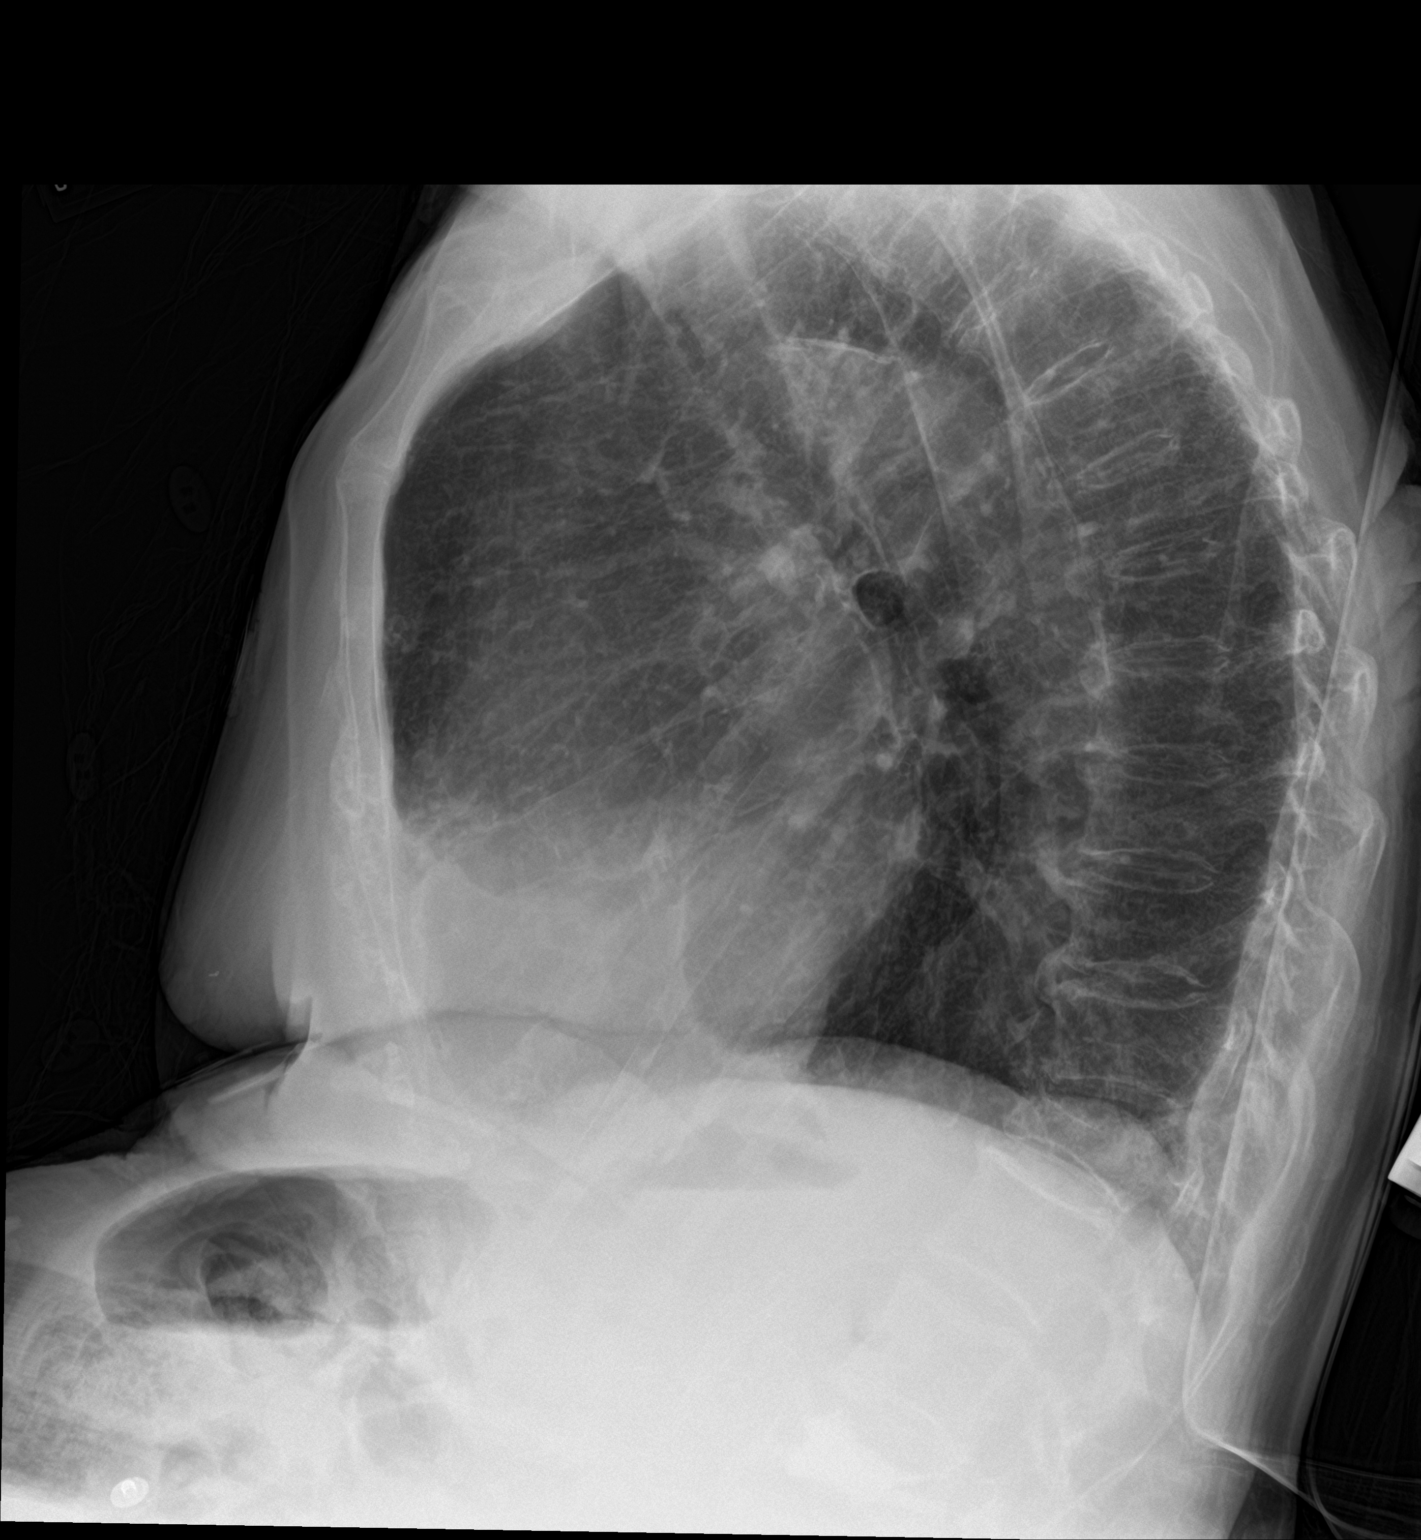

[2 of 2 positions shown; findings below may reference images not displayed]

FINDINGS: The heart is normal in size. Atherosclerosis of the thoracic aorta
with mild tortuosity. There is biapical pleuroparenchymal scarring.
Ill-defined patchy opacity at both lung bases. No pulmonary edema.
There may be a small left pleural effusion. The bones are under
mineralized. T12 compression fracture is age indeterminate, but not
definitively seen on 08/23/2021 abdominal CT. L3 compression
deformity with vertebral augmentation of chronic
IMPRESSION: 1. Ill-defined patchy opacity at both lung bases, may be atelectasis
or pneumonia. Possible small left pleural effusion.
2. T12 compression fracture, age indeterminate but possibly
acute/subacute.
